# Patient Record
Sex: Female | Born: 1980 | Race: White | Hispanic: No | Marital: Single | Smoking: Current every day smoker
Health system: Southern US, Community
[De-identification: ages and names within clinical notes are randomized; demographics above are authoritative.]

## PROBLEM LIST (undated history)

## (undated) ENCOUNTER — Inpatient Hospital Stay (HOSPITAL_COMMUNITY): Payer: Self-pay

## (undated) DIAGNOSIS — K219 Gastro-esophageal reflux disease without esophagitis: Secondary | ICD-10-CM

## (undated) DIAGNOSIS — C099 Malignant neoplasm of tonsil, unspecified: Secondary | ICD-10-CM

## (undated) DIAGNOSIS — C73 Malignant neoplasm of thyroid gland: Secondary | ICD-10-CM

## (undated) DIAGNOSIS — B009 Herpesviral infection, unspecified: Secondary | ICD-10-CM

## (undated) DIAGNOSIS — C539 Malignant neoplasm of cervix uteri, unspecified: Secondary | ICD-10-CM

## (undated) DIAGNOSIS — F419 Anxiety disorder, unspecified: Secondary | ICD-10-CM

## (undated) DIAGNOSIS — E890 Postprocedural hypothyroidism: Secondary | ICD-10-CM

## (undated) HISTORY — PX: CERVICAL CONE BIOPSY: SUR198

## (undated) HISTORY — PX: APPENDECTOMY: SHX54

## (undated) HISTORY — PX: KNEE SURGERY: SHX244

## (undated) HISTORY — PX: CERVIX SURGERY: SHX593

## (undated) HISTORY — PX: WISDOM TOOTH EXTRACTION: SHX21

## (undated) HISTORY — PX: DIAGNOSTIC LAPAROSCOPY: SUR761

---
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
## 1999-02-13 ENCOUNTER — Inpatient Hospital Stay (HOSPITAL_COMMUNITY): Admission: AD | Admit: 1999-02-13 | Discharge: 1999-02-16 | Payer: Self-pay | Admitting: Obstetrics & Gynecology

## 1999-02-13 ENCOUNTER — Encounter (INDEPENDENT_AMBULATORY_CARE_PROVIDER_SITE_OTHER): Payer: Self-pay | Admitting: Specialist

## 2004-06-27 ENCOUNTER — Emergency Department (HOSPITAL_COMMUNITY): Admission: EM | Admit: 2004-06-27 | Discharge: 2004-06-28 | Payer: Self-pay

## 2004-07-07 ENCOUNTER — Emergency Department (HOSPITAL_COMMUNITY): Admission: EM | Admit: 2004-07-07 | Discharge: 2004-07-08 | Payer: Self-pay | Admitting: Emergency Medicine

## 2004-10-29 ENCOUNTER — Ambulatory Visit (HOSPITAL_COMMUNITY): Admission: RE | Admit: 2004-10-29 | Discharge: 2004-10-29 | Payer: Self-pay | Admitting: Obstetrics and Gynecology

## 2004-10-29 ENCOUNTER — Encounter (INDEPENDENT_AMBULATORY_CARE_PROVIDER_SITE_OTHER): Payer: Self-pay | Admitting: Specialist

## 2005-02-19 ENCOUNTER — Emergency Department (HOSPITAL_COMMUNITY): Admission: EM | Admit: 2005-02-19 | Discharge: 2005-02-19 | Payer: Self-pay | Admitting: Emergency Medicine

## 2005-08-17 IMAGING — CT CT ABDOMEN W/ CM
1 of 4 series · 14 of 32 positions shown, 19 images · IV contrast (omnipaque)
Comparison: 06/28/04.

CLINICAL DATA: Worsening right lower abdominal and pelvic pain.  On antibiotics for pelvic inflammatory disease.  Evaluate for tube ovarian abscess or other acute findings.
TECHNIQUE: Multidetector CT imaging of the abdomen and pelvis was performed during administration of 150 cc Omnipaque 300 intravenous contrast.  Oral contrast was also administered.

[Series 3: — · axial · 0.68mm/px · z∈[+988,+1392]mm · 14 of 93 slices shown, 19 images]
[im 6/93  soft-tissue]
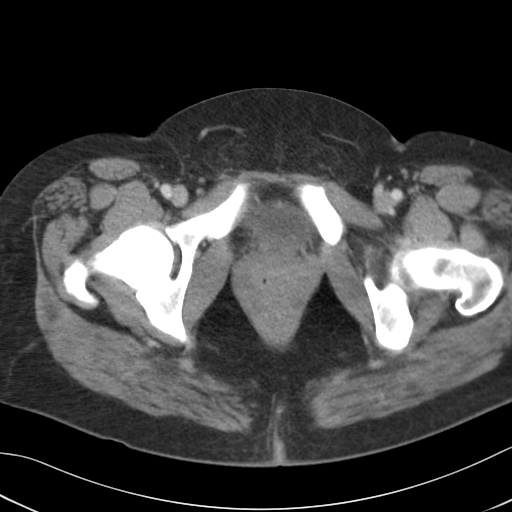
[im 6/93  bone]
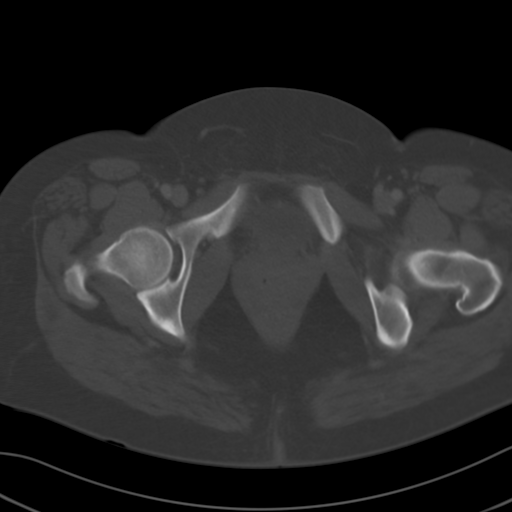
[im 12/93  soft-tissue]
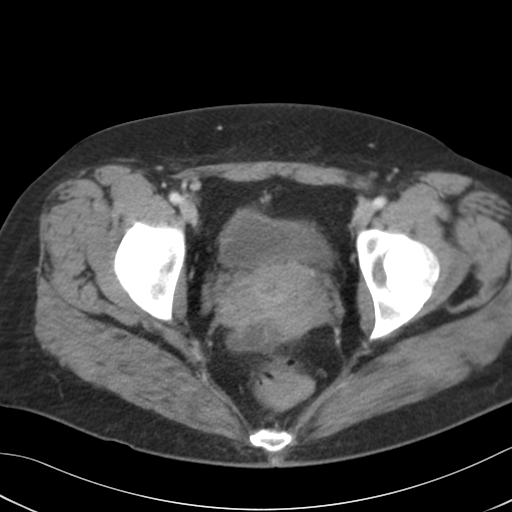
[im 18/93  soft-tissue]
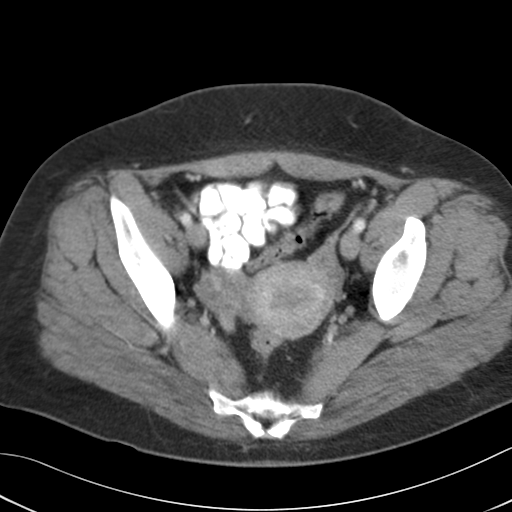
[im 29/93  soft-tissue]
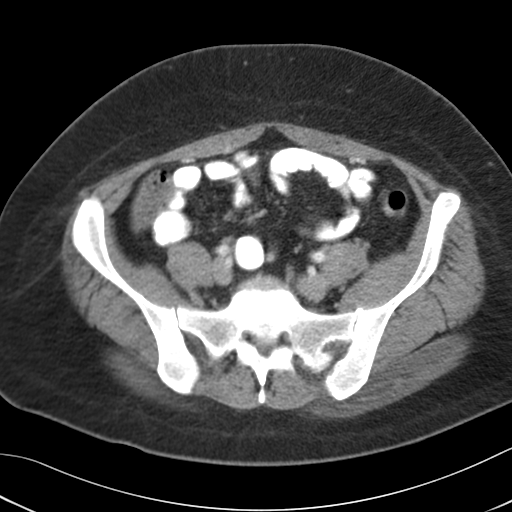
[im 35/93  soft-tissue]
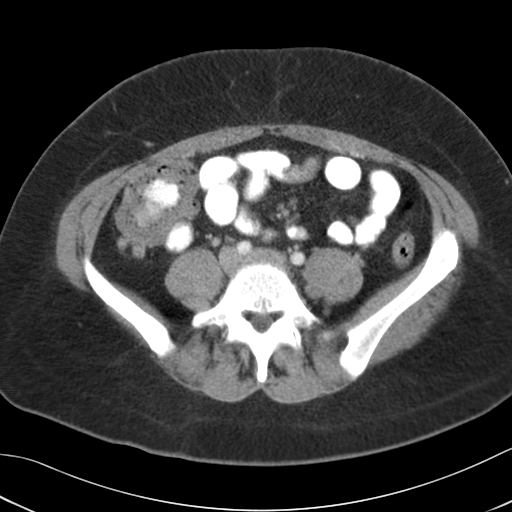
[im 41/93  soft-tissue]
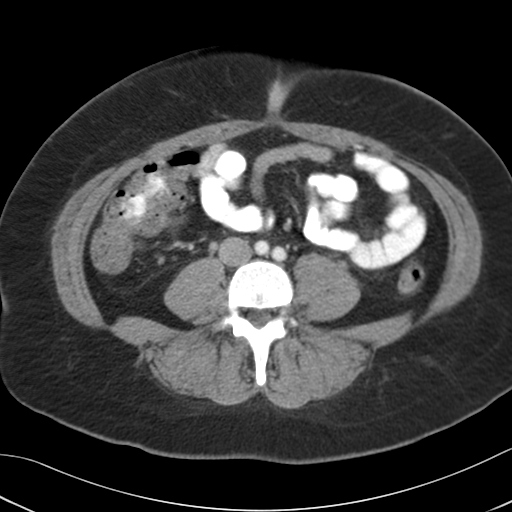
[im 47/93  soft-tissue]
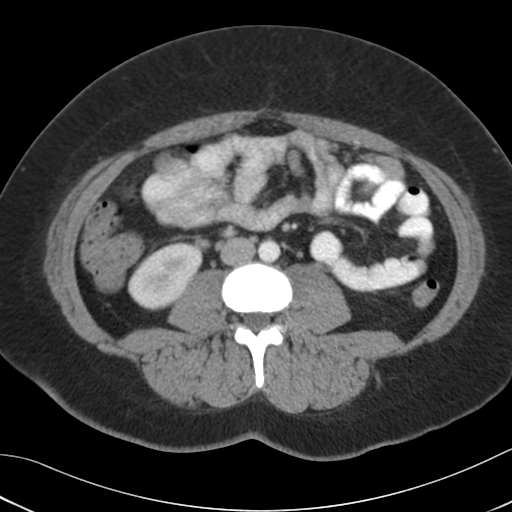
[im 52/93  soft-tissue]
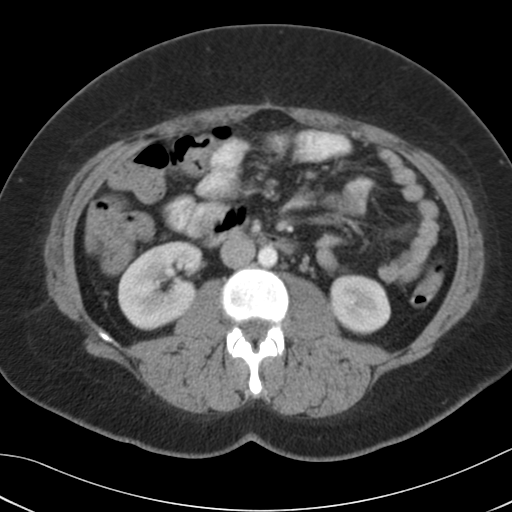
[im 58/93  soft-tissue]
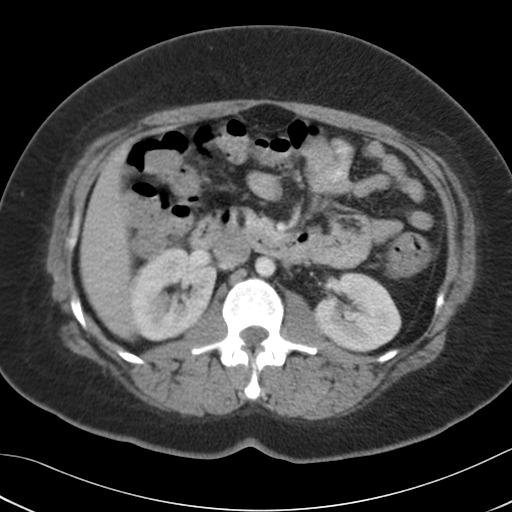
[im 58/93  bone]
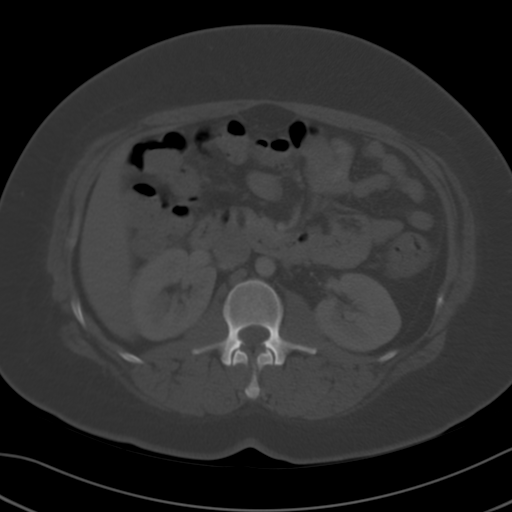
[im 64/93  soft-tissue]
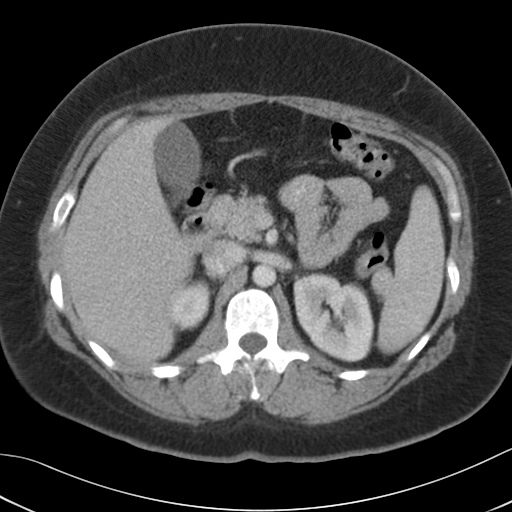
[im 70/93  lung]
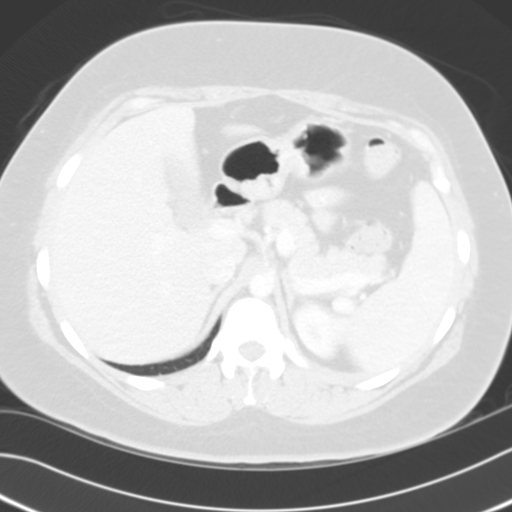
[im 75/93  soft-tissue]
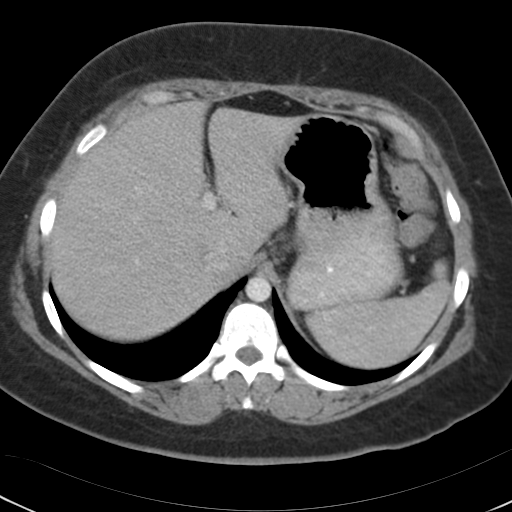
[im 75/93  lung]
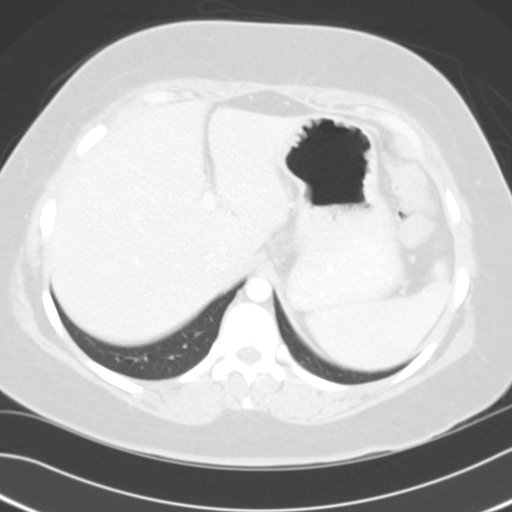
[im 81/93  soft-tissue]
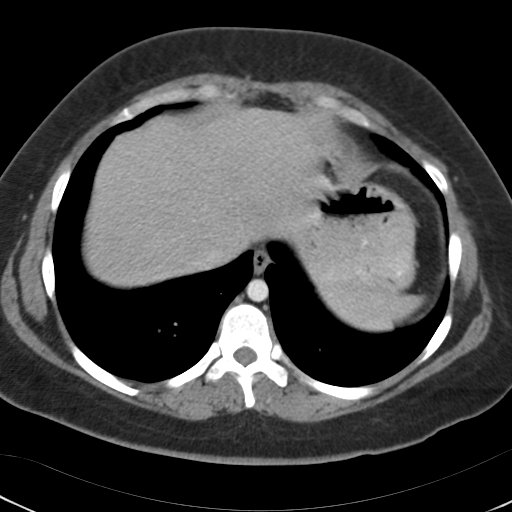
[im 81/93  lung]
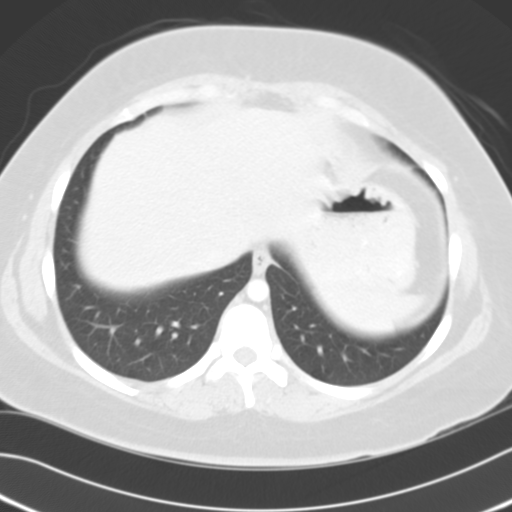
[im 87/93  soft-tissue]
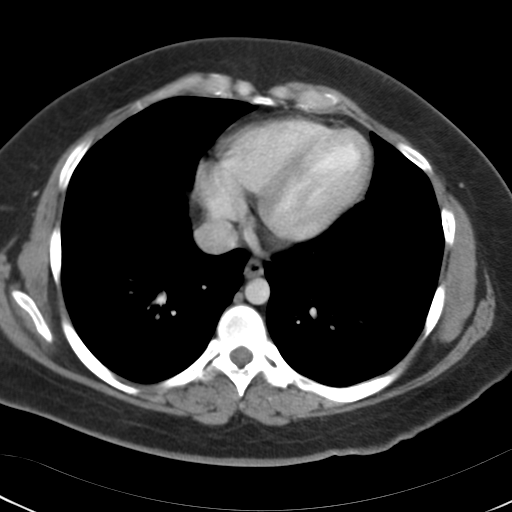
[im 87/93  lung]
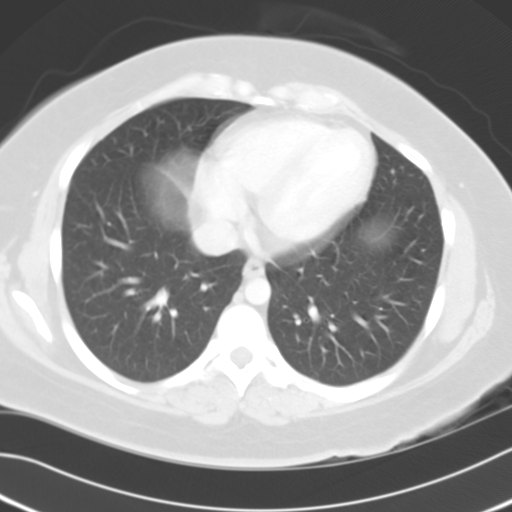

[14 of 32 positions shown; findings below may reference images not displayed]

ABDOMEN CT WITH CONTRAST:
 The abdominal parenchymal organs are normal in appearance.  The gallbladder is unremarkable. 
 No abnormal soft tissue masses are seen.  There is no evidence of adenopathy.  There is no evidence of inflammatory process or abnormal fluid collections.
IMPRESSION: Negative abdomen CT.
 PELVIS CT WITH CONTRAST: 
 There has been near complete resolution of right-sided hydropyosalpinx since prior study.  No masses or adenopathy are identified.  There is no evidence of free fluid.  There is no evidence of other inflammatory process or abnormal fluid collections.  The pelvic bowel loops are unremarkable.
IMPRESSION: Near complete resolution of right-sided hydropyosalpinx since prior study.  No acute findings.

## 2005-12-30 ENCOUNTER — Emergency Department (HOSPITAL_COMMUNITY): Admission: EM | Admit: 2005-12-30 | Discharge: 2005-12-30 | Payer: Self-pay | Admitting: Emergency Medicine

## 2006-10-04 ENCOUNTER — Emergency Department (HOSPITAL_COMMUNITY): Admission: EM | Admit: 2006-10-04 | Discharge: 2006-10-04 | Payer: Self-pay | Admitting: Emergency Medicine

## 2006-10-05 ENCOUNTER — Emergency Department (HOSPITAL_COMMUNITY): Admission: EM | Admit: 2006-10-05 | Discharge: 2006-10-05 | Payer: Self-pay | Admitting: Emergency Medicine

## 2007-02-24 ENCOUNTER — Inpatient Hospital Stay (HOSPITAL_COMMUNITY): Admission: AD | Admit: 2007-02-24 | Discharge: 2007-02-25 | Payer: Self-pay | Admitting: Family Medicine

## 2007-03-04 ENCOUNTER — Inpatient Hospital Stay (HOSPITAL_COMMUNITY): Admission: RE | Admit: 2007-03-04 | Discharge: 2007-03-04 | Payer: Self-pay | Admitting: Family Medicine

## 2007-03-11 ENCOUNTER — Ambulatory Visit (HOSPITAL_COMMUNITY): Admission: RE | Admit: 2007-03-11 | Discharge: 2007-03-11 | Payer: Self-pay | Admitting: Family Medicine

## 2007-03-24 ENCOUNTER — Inpatient Hospital Stay (HOSPITAL_COMMUNITY): Admission: AD | Admit: 2007-03-24 | Discharge: 2007-03-24 | Payer: Self-pay | Admitting: Obstetrics and Gynecology

## 2007-03-31 ENCOUNTER — Ambulatory Visit (HOSPITAL_COMMUNITY): Admission: RE | Admit: 2007-03-31 | Discharge: 2007-03-31 | Payer: Self-pay | Admitting: Obstetrics and Gynecology

## 2007-04-05 ENCOUNTER — Ambulatory Visit (HOSPITAL_COMMUNITY): Admission: RE | Admit: 2007-04-05 | Discharge: 2007-04-05 | Payer: Self-pay | Admitting: Obstetrics and Gynecology

## 2007-04-05 ENCOUNTER — Encounter (INDEPENDENT_AMBULATORY_CARE_PROVIDER_SITE_OTHER): Payer: Self-pay | Admitting: Obstetrics and Gynecology

## 2007-04-05 DIAGNOSIS — O034 Incomplete spontaneous abortion without complication: Secondary | ICD-10-CM | POA: Insufficient documentation

## 2007-04-26 ENCOUNTER — Emergency Department (HOSPITAL_COMMUNITY): Admission: EM | Admit: 2007-04-26 | Discharge: 2007-04-26 | Payer: Self-pay | Admitting: Emergency Medicine

## 2007-06-14 ENCOUNTER — Emergency Department (HOSPITAL_COMMUNITY): Admission: EM | Admit: 2007-06-14 | Discharge: 2007-06-15 | Payer: Self-pay | Admitting: *Deleted

## 2007-07-09 ENCOUNTER — Emergency Department (HOSPITAL_COMMUNITY): Admission: EM | Admit: 2007-07-09 | Discharge: 2007-07-09 | Payer: Self-pay | Admitting: *Deleted

## 2007-09-30 ENCOUNTER — Ambulatory Visit: Payer: Self-pay | Admitting: Nurse Practitioner

## 2007-09-30 DIAGNOSIS — B977 Papillomavirus as the cause of diseases classified elsewhere: Secondary | ICD-10-CM

## 2007-09-30 DIAGNOSIS — F332 Major depressive disorder, recurrent severe without psychotic features: Secondary | ICD-10-CM | POA: Insufficient documentation

## 2007-09-30 DIAGNOSIS — N949 Unspecified condition associated with female genital organs and menstrual cycle: Secondary | ICD-10-CM

## 2007-09-30 LAB — CONVERTED CEMR LAB
ALT: 19 units/L (ref 0–35)
AST: 15 units/L (ref 0–37)
Albumin: 4.6 g/dL (ref 3.5–5.2)
Alkaline Phosphatase: 104 units/L (ref 39–117)
BUN: 9 mg/dL (ref 6–23)
Basophils Absolute: 0 10*3/uL (ref 0.0–0.1)
Basophils Relative: 0 % (ref 0–1)
Beta hcg, urine, semiquantitative: NEGATIVE
Bilirubin Urine: NEGATIVE
Blood in Urine, dipstick: NEGATIVE
CO2: 22 meq/L (ref 19–32)
Calcium: 9.3 mg/dL (ref 8.4–10.5)
Chloride: 104 meq/L (ref 96–112)
Creatinine, Ser: 0.66 mg/dL (ref 0.40–1.20)
Eosinophils Absolute: 0.2 10*3/uL (ref 0.0–0.7)
Eosinophils Relative: 2 % (ref 0–5)
Glucose, Bld: 75 mg/dL (ref 70–99)
Glucose, Urine, Semiquant: NEGATIVE
HCT: 43.8 % (ref 36.0–46.0)
Hemoglobin: 14.6 g/dL (ref 12.0–15.0)
Ketones, urine, test strip: NEGATIVE
Lymphocytes Relative: 26 % (ref 12–46)
Lymphs Abs: 2.6 10*3/uL (ref 0.7–4.0)
MCHC: 33.3 g/dL (ref 30.0–36.0)
MCV: 91.6 fL (ref 78.0–100.0)
Monocytes Absolute: 0.5 10*3/uL (ref 0.1–1.0)
Monocytes Relative: 5 % (ref 3–12)
Neutro Abs: 6.7 10*3/uL (ref 1.7–7.7)
Neutrophils Relative %: 67 % (ref 43–77)
Nitrite: NEGATIVE
Platelets: 212 10*3/uL (ref 150–400)
Potassium: 3.9 meq/L (ref 3.5–5.3)
Preg, Serum: NEGATIVE
Protein, U semiquant: 30
RBC: 4.78 M/uL (ref 3.87–5.11)
RDW: 13.9 % (ref 11.5–15.5)
Sodium: 139 meq/L (ref 135–145)
Specific Gravity, Urine: 1.025
TSH: 1.416 microintl units/mL (ref 0.350–5.50)
Total Bilirubin: 0.5 mg/dL (ref 0.3–1.2)
Total Protein: 6.8 g/dL (ref 6.0–8.3)
Urobilinogen, UA: 0.2
WBC: 10 10*3/uL (ref 4.0–10.5)
pH: 6

## 2007-10-03 ENCOUNTER — Encounter (INDEPENDENT_AMBULATORY_CARE_PROVIDER_SITE_OTHER): Payer: Self-pay | Admitting: Nurse Practitioner

## 2007-10-06 ENCOUNTER — Encounter (INDEPENDENT_AMBULATORY_CARE_PROVIDER_SITE_OTHER): Payer: Self-pay | Admitting: Nurse Practitioner

## 2007-10-10 ENCOUNTER — Encounter (INDEPENDENT_AMBULATORY_CARE_PROVIDER_SITE_OTHER): Payer: Self-pay | Admitting: Nurse Practitioner

## 2007-10-11 ENCOUNTER — Encounter (INDEPENDENT_AMBULATORY_CARE_PROVIDER_SITE_OTHER): Payer: Self-pay | Admitting: Nurse Practitioner

## 2007-10-14 ENCOUNTER — Encounter (INDEPENDENT_AMBULATORY_CARE_PROVIDER_SITE_OTHER): Payer: Self-pay | Admitting: Nurse Practitioner

## 2007-12-16 ENCOUNTER — Ambulatory Visit (HOSPITAL_BASED_OUTPATIENT_CLINIC_OR_DEPARTMENT_OTHER): Admission: RE | Admit: 2007-12-16 | Discharge: 2007-12-16 | Payer: Self-pay | Admitting: Obstetrics and Gynecology

## 2008-01-09 ENCOUNTER — Ambulatory Visit: Payer: Self-pay | Admitting: Family Medicine

## 2008-01-09 DIAGNOSIS — F17200 Nicotine dependence, unspecified, uncomplicated: Secondary | ICD-10-CM | POA: Insufficient documentation

## 2008-01-09 DIAGNOSIS — J209 Acute bronchitis, unspecified: Secondary | ICD-10-CM | POA: Insufficient documentation

## 2008-04-20 IMAGING — US US OB TRANSVAGINAL
1 series · 14 of 25 positions shown · non-contrast
Comparison: none

OBSTETRICAL ULTRASOUND:

 This ultrasound exam was performed in the [HOSPITAL] Ultrasound Department.  The OB US report was generated in the AS system, and faxed to the ordering physician.  This report is also available in [REDACTED] PACS.

[Series 1: us ob transvaginal · 14 of 25 slices shown]
[im 1/25]
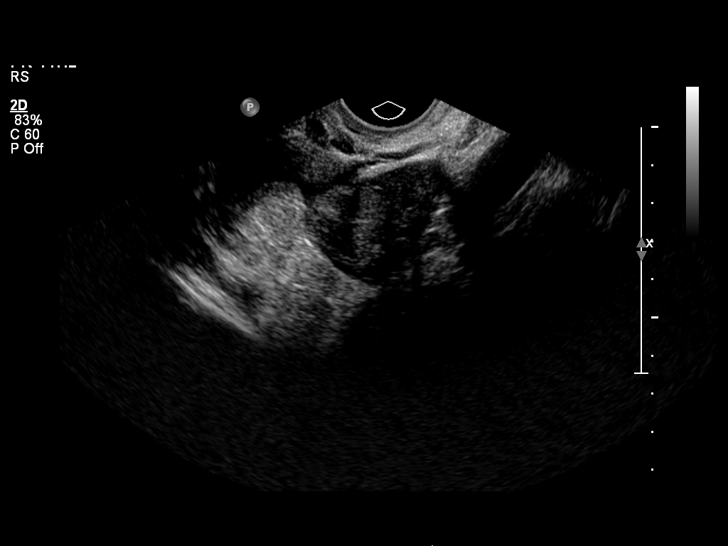
[im 3/25]
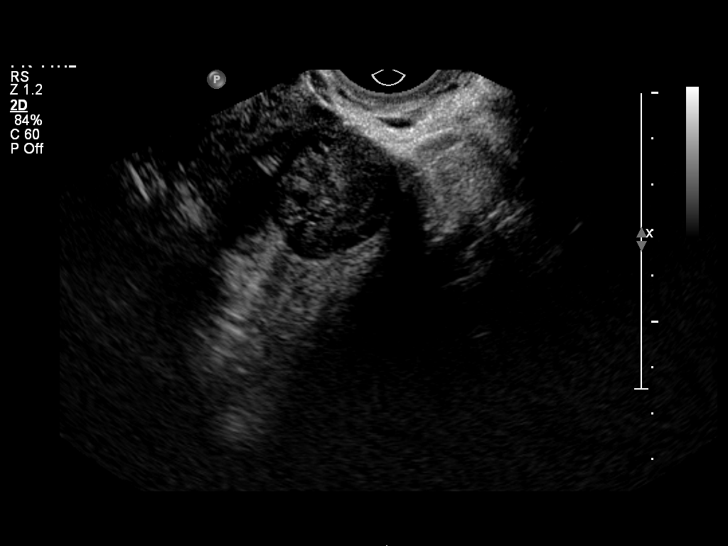
[im 5/25]
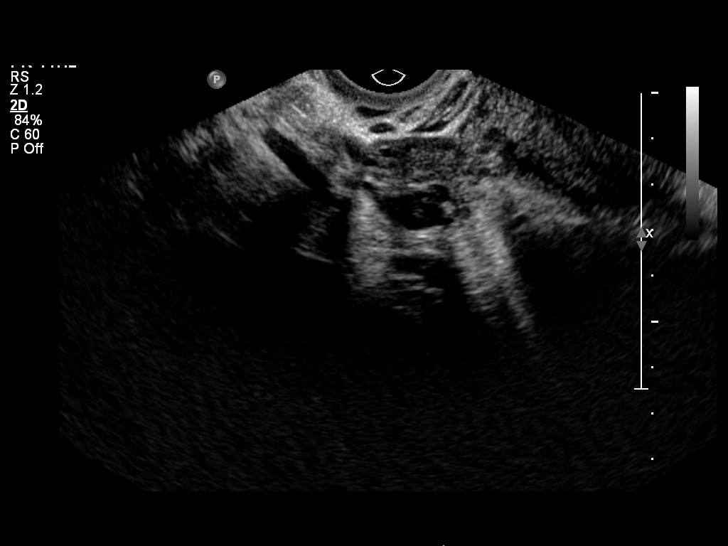
[im 7/25]
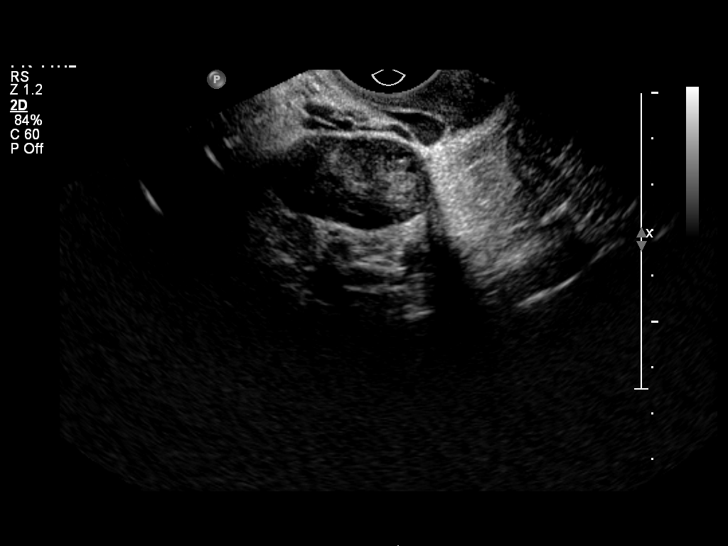
[im 9/25]
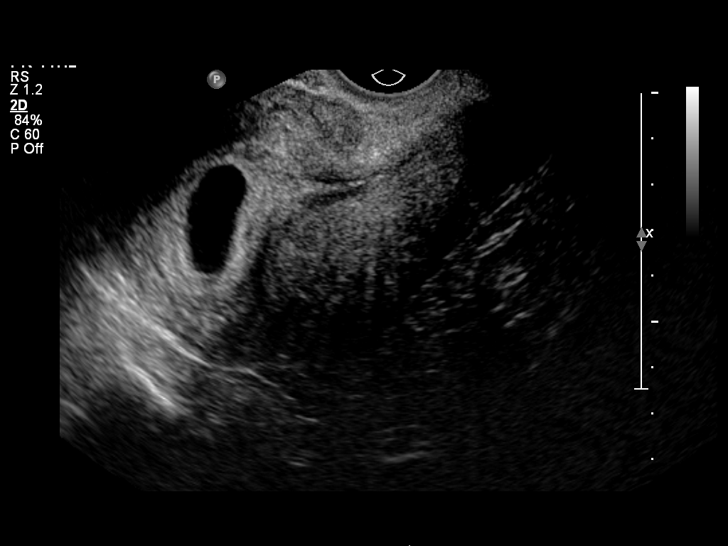
[im 10/25]
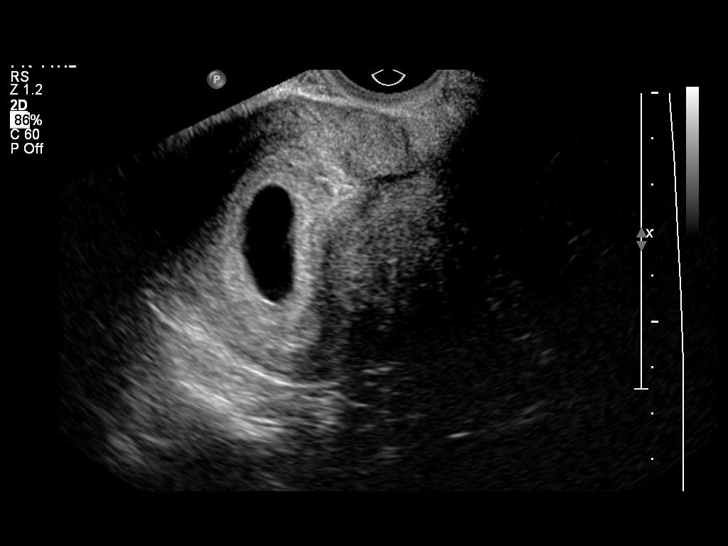
[im 12/25]
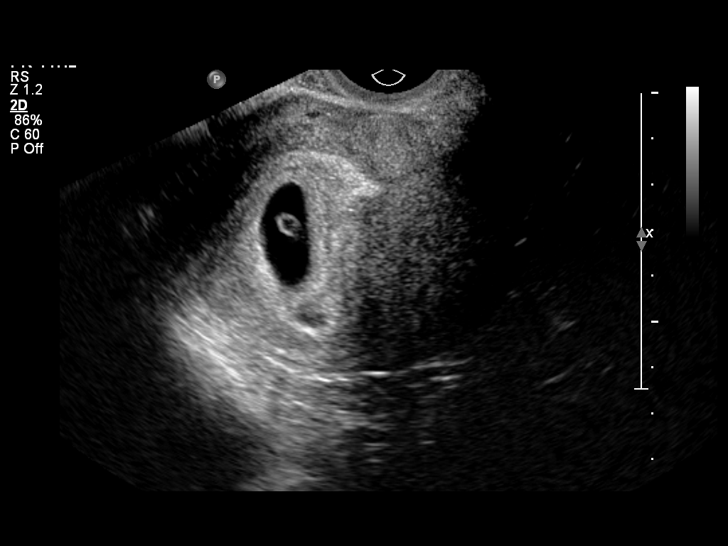
[im 14/25]
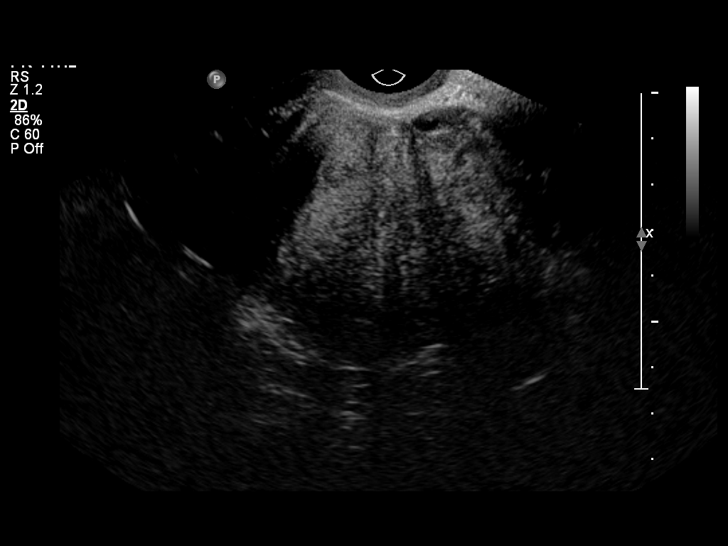
[im 16/25]
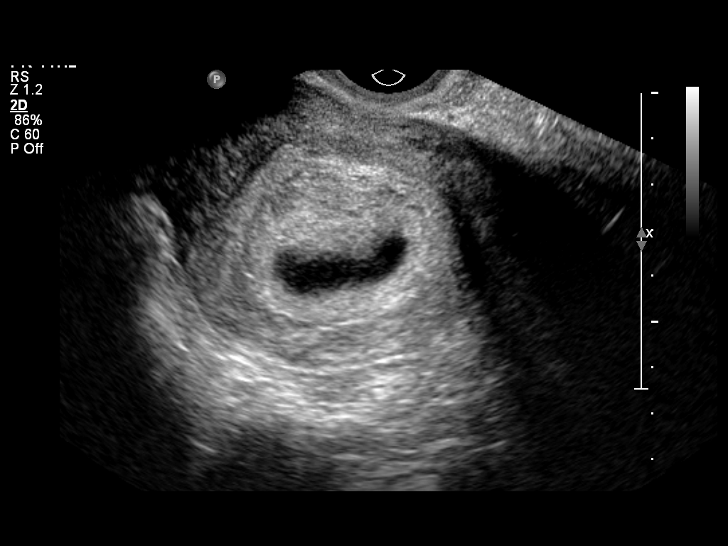
[im 17/25]
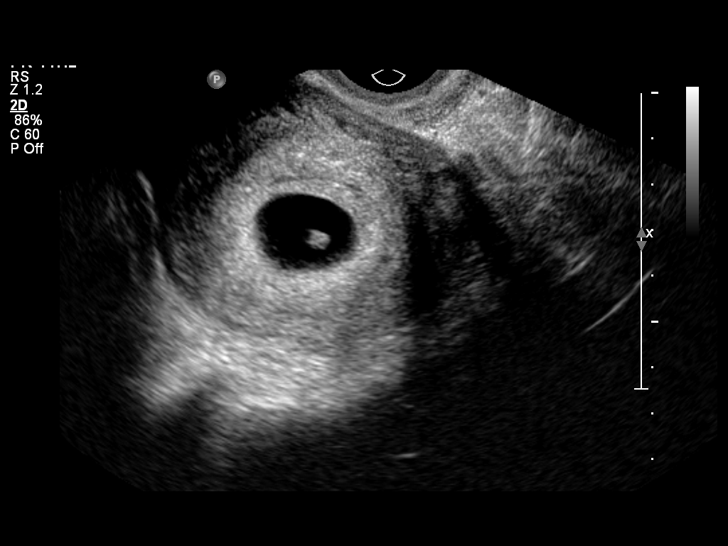
[im 19/25]
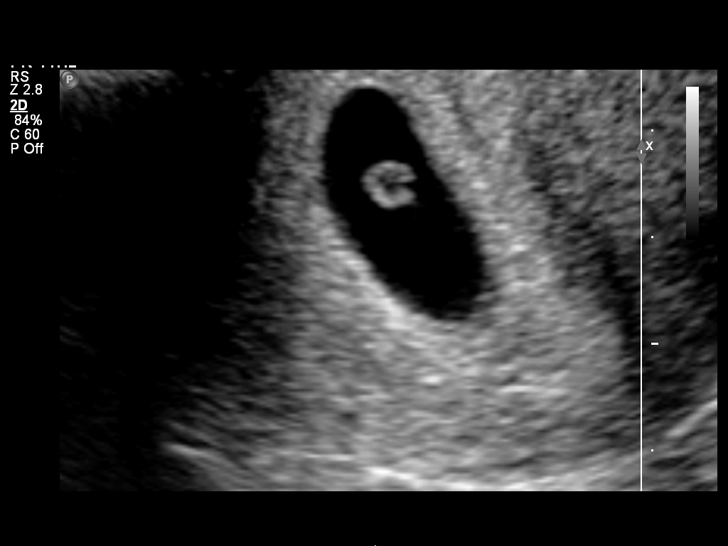
[im 21/25]
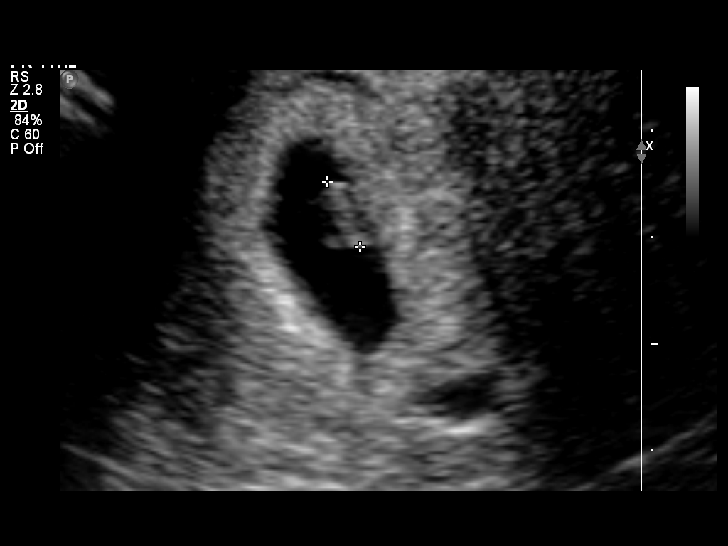
[im 23/25]
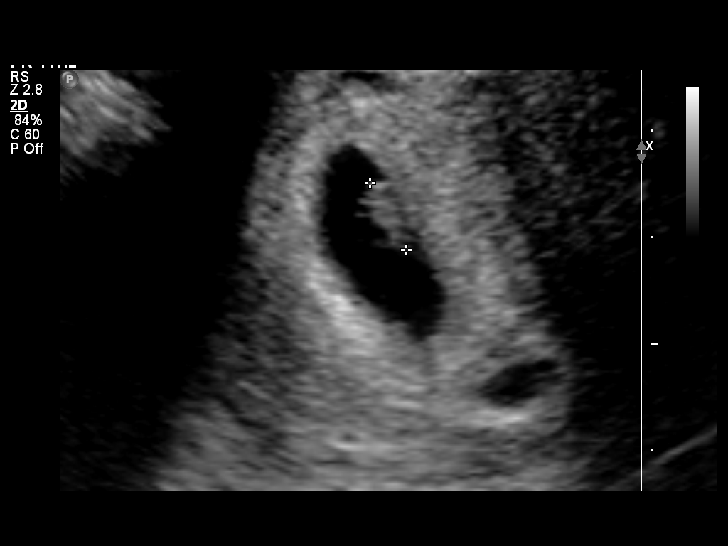
[im 25/25]
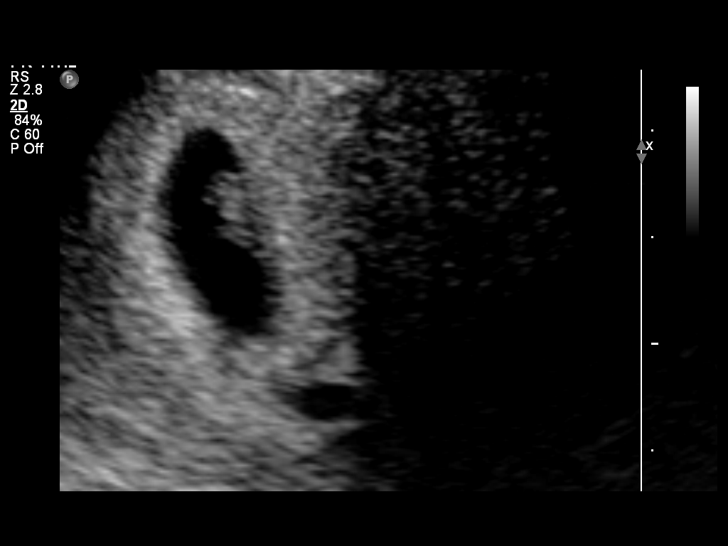

[14 of 25 positions shown; findings below may reference images not displayed]

IMPRESSION: See AS Obstetric US report.

## 2008-06-12 ENCOUNTER — Encounter: Payer: Self-pay | Admitting: Emergency Medicine

## 2008-06-12 ENCOUNTER — Inpatient Hospital Stay (HOSPITAL_COMMUNITY): Admission: AD | Admit: 2008-06-12 | Discharge: 2008-06-15 | Payer: Self-pay | Admitting: General Surgery

## 2008-06-12 ENCOUNTER — Encounter (INDEPENDENT_AMBULATORY_CARE_PROVIDER_SITE_OTHER): Payer: Self-pay | Admitting: Surgery

## 2008-11-29 ENCOUNTER — Ambulatory Visit: Payer: Self-pay | Admitting: Nurse Practitioner

## 2008-11-29 DIAGNOSIS — E669 Obesity, unspecified: Secondary | ICD-10-CM | POA: Insufficient documentation

## 2008-11-29 DIAGNOSIS — K219 Gastro-esophageal reflux disease without esophagitis: Secondary | ICD-10-CM

## 2008-11-29 DIAGNOSIS — R5383 Other fatigue: Secondary | ICD-10-CM

## 2008-11-29 DIAGNOSIS — R5381 Other malaise: Secondary | ICD-10-CM

## 2008-12-03 DIAGNOSIS — E559 Vitamin D deficiency, unspecified: Secondary | ICD-10-CM | POA: Insufficient documentation

## 2008-12-03 LAB — CONVERTED CEMR LAB
ALT: 32 units/L (ref 0–35)
AST: 20 units/L (ref 0–37)
Albumin: 4.6 g/dL (ref 3.5–5.2)
Alkaline Phosphatase: 114 units/L (ref 39–117)
BUN: 12 mg/dL (ref 6–23)
Basophils Absolute: 0 10*3/uL (ref 0.0–0.1)
Basophils Relative: 0 % (ref 0–1)
CO2: 21 meq/L (ref 19–32)
Calcium: 9.5 mg/dL (ref 8.4–10.5)
Chloride: 105 meq/L (ref 96–112)
Creatinine, Ser: 0.82 mg/dL (ref 0.40–1.20)
Eosinophils Absolute: 0.2 10*3/uL (ref 0.0–0.7)
Eosinophils Relative: 2 % (ref 0–5)
Glucose, Bld: 69 mg/dL — ABNORMAL LOW (ref 70–99)
HCT: 43.8 % (ref 36.0–46.0)
Helicobacter pylori, IgM: <0.89 (ref <0.89)
Hemoglobin: 15 g/dL (ref 12.0–15.0)
Lymphocytes Relative: 31 % (ref 12–46)
Lymphs Abs: 3.5 10*3/uL (ref 0.7–4.0)
MCHC: 34.2 g/dL (ref 30.0–36.0)
MCV: 89.2 fL (ref 78.0–100.0)
Monocytes Absolute: 0.5 10*3/uL (ref 0.1–1.0)
Monocytes Relative: 4 % (ref 3–12)
Neutro Abs: 6.9 10*3/uL (ref 1.7–7.7)
Neutrophils Relative %: 62 % (ref 43–77)
Platelets: 211 10*3/uL (ref 150–400)
Potassium: 4 meq/L (ref 3.5–5.3)
RBC: 4.91 M/uL (ref 3.87–5.11)
RDW: 13.1 % (ref 11.5–15.5)
Sodium: 141 meq/L (ref 135–145)
TSH: 1.492 microintl units/mL (ref 0.350–4.500)
Total Bilirubin: 0.5 mg/dL (ref 0.3–1.2)
Total Protein: 7 g/dL (ref 6.0–8.3)
Vit D, 25-Hydroxy: 11 ng/mL — ABNORMAL LOW (ref 30–89)
WBC: 11.1 10*3/uL — ABNORMAL HIGH (ref 4.0–10.5)

## 2009-02-19 ENCOUNTER — Ambulatory Visit (HOSPITAL_COMMUNITY): Admission: RE | Admit: 2009-02-19 | Discharge: 2009-02-19 | Payer: Self-pay | Admitting: Obstetrics and Gynecology

## 2009-02-19 ENCOUNTER — Encounter (INDEPENDENT_AMBULATORY_CARE_PROVIDER_SITE_OTHER): Payer: Self-pay | Admitting: Obstetrics and Gynecology

## 2009-07-01 ENCOUNTER — Telehealth (INDEPENDENT_AMBULATORY_CARE_PROVIDER_SITE_OTHER): Payer: Self-pay | Admitting: Internal Medicine

## 2009-07-15 ENCOUNTER — Ambulatory Visit: Payer: Self-pay | Admitting: Physician Assistant

## 2009-07-15 DIAGNOSIS — H6690 Otitis media, unspecified, unspecified ear: Secondary | ICD-10-CM | POA: Insufficient documentation

## 2009-10-31 ENCOUNTER — Telehealth (INDEPENDENT_AMBULATORY_CARE_PROVIDER_SITE_OTHER): Payer: Self-pay | Admitting: Nurse Practitioner

## 2009-11-03 ENCOUNTER — Emergency Department (HOSPITAL_BASED_OUTPATIENT_CLINIC_OR_DEPARTMENT_OTHER): Admission: EM | Admit: 2009-11-03 | Discharge: 2009-11-03 | Payer: Self-pay | Admitting: Emergency Medicine

## 2009-11-07 ENCOUNTER — Telehealth (INDEPENDENT_AMBULATORY_CARE_PROVIDER_SITE_OTHER): Payer: Self-pay | Admitting: Nurse Practitioner

## 2010-01-17 ENCOUNTER — Telehealth (INDEPENDENT_AMBULATORY_CARE_PROVIDER_SITE_OTHER): Payer: Self-pay | Admitting: Nurse Practitioner

## 2010-01-18 ENCOUNTER — Encounter (INDEPENDENT_AMBULATORY_CARE_PROVIDER_SITE_OTHER): Payer: Self-pay | Admitting: Nurse Practitioner

## 2010-01-18 ENCOUNTER — Emergency Department (HOSPITAL_BASED_OUTPATIENT_CLINIC_OR_DEPARTMENT_OTHER): Admission: EM | Admit: 2010-01-18 | Discharge: 2010-01-18 | Payer: Self-pay | Admitting: Emergency Medicine

## 2010-01-22 ENCOUNTER — Ambulatory Visit: Payer: Self-pay | Admitting: Nurse Practitioner

## 2010-01-22 DIAGNOSIS — R059 Cough, unspecified: Secondary | ICD-10-CM | POA: Insufficient documentation

## 2010-01-22 DIAGNOSIS — R05 Cough: Secondary | ICD-10-CM

## 2010-08-26 NOTE — Progress Notes (Signed)
Summary: TOENAIL IS INFECTED AND VERY SORE  Phone Note Call from Patient Call back at Home Phone [phone]   Reason for Call: Talk to Nurse Summary of Call: MARTIN PT MS Meeker CALLED AND SAYS THAT HER BIG TOE ON HER LEFT FOOT IS PROBABLY INFECTED AND ITS CAUSING HER SOME PAIN AND THE NAIL IS RAISING UP AND KIND OF SITTING TO THE SIDE AND VERY SORE. Initial call taken by: Leodis Rains,  January 17, 2010 12:48 PM  Follow-up for Phone Call        pt went to hospital and they cut side of nail off and told pt that she would need to follow up with PCP to get referred to podiatry.  Pt is also having a cough and wanted to know it she can get medication prescribed for it she is scheduled to come in tomorrow for a f/u on toe and cough. Follow-up by: Levon Hedger,  January 21, 2010 5:05 PM

## 2010-08-26 NOTE — Assessment & Plan Note (Signed)
Summary: Acute - Bronchitis   Vital Signs:  Patient profile:   30 year old female Menstrual status:  irregular LMP:     12/2009 Weight:      249.0 pounds BMI:     39.14 Temp:     97.6 degrees F oral Pulse rate:   81 / minute Pulse rhythm:   regular Resp:     20 per minute BP sitting:   125 / 82  (left arm) Cuff size:   large  Vitals Entered By: Levon Hedger (January 22, 2010 11:50 AM) CC: follow-up visit from ER...cough x 3 weeks, Abdominal Pain Is Patient Diabetic? No Pain Assessment Patient in pain? no       Does patient need assistance? Functional Status Self care Ambulation Normal LMP (date): 12/2009     Enter LMP: 12/2009   Primary Care Provider:  Lehman Prom FNP  CC:  follow-up visit from ER...cough x 3 weeks and Abdominal Pain.  History of Present Illness:  Pt into the ER with C/O ingrown toenail Left toenail with both medial and lateral edges of toenail removed constant pain alleviated but  Still unable to allow the covers to touch the toe at night given naprosyn as needed but she has only been taking otc aleve  Conjunctivis - left eye redness on 01/18/2010 upon presentation to the ER given erythromycin ointment and symptoms have resolved  Cough - mostly when she wakes in the morning and lays down at night. Brownish sputum production Tobacco use continues No hx of asthma right ear pain at times with swallowing denies any headache    Dyspepsia History:      She has no alarm features of dyspepsia including no history of melena, hematochezia, dysphagia, persistent vomiting, or involuntary weight loss > 5%.  There is a prior history of GERD.  The patient does not have a prior history of documented ulcer disease.  The dominant symptom is heartburn or acid reflux.  An H-2 blocker medication is not currently being taken.  She has no history of a positive H. Pylori serology.  No previous upper endoscopy has been done.     Habits &  Providers  Alcohol-Tobacco-Diet     Alcohol drinks/day: 0     Tobacco Status: current     Tobacco Counseling: to quit use of tobacco products     Cigarette Packs/Day: 1.0  Exercise-Depression-Behavior     Does Patient Exercise: no     Drug Use: never     Seat Belt Use: 100     Sun Exposure: occasionally  Allergies (verified): 1)  ! Prednisone  Review of Systems General:  Denies fever. ENT:  Complains of earache and sore throat. CV:  Denies chest pain or discomfort. Resp:  Complains of cough. GI:  Complains of indigestion; denies abdominal pain.  Physical Exam  General:  alert.   Head:  normocephalic.   Eyes:  pupils round.   Ears:  slight redness with left TM Nose:  no nasal discharge.   Mouth:  pharynx pink and moist.   Lungs:  normal breath sounds.   Heart:  normal rate and regular rhythm.   Abdomen:  normal bowel sounds.   Msk:  up to the exam table Neurologic:  alert & oriented X3.   Skin:  left great toe - evidence of medial and lateral toenail removal no swelling Psych:  Oriented X3.     Impression & Recommendations:  Problem # 1:  BRONCHITIS, ACUTE (ICD-466.0)  advised pt to  get mucinex may take tylenol with codeine in between  The following medications were removed from the medication list:    Tessalon Perles 100 Mg Caps (Benzonatate) .Marland Kitchen... Take 1 tablet by mouth three times a day as needed for cough Her updated medication list for this problem includes:    Proventil Hfa 108 (90 Base) Mcg/act Aers (Albuterol sulfate) .Marland Kitchen... 2 puffs ever 6 hours as needed for cough and shortness of breath  Problem # 2:  TOBACCO ABUSE (ICD-305.1) advised cessation  Problem # 3:  GERD (ICD-530.81) will start on nexium The following medications were removed from the medication list:    Omeprazole 20 Mg Tbec (Omeprazole) ..... One capsule by mouth twice daily before breakfast and dinner Her updated medication list for this problem includes:    Nexium 40 Mg Cpdr  (Esomeprazole magnesium) ..... One tablet by mouth before breakfast  Complete Medication List: 1)  Flonase 50 Mcg/act Susp (Fluticasone propionate) .Marland Kitchen.. 1 spray each nostril once daily (use for 3 weeks, then taper to stop) 2)  Nexium 40 Mg Cpdr (Esomeprazole magnesium) .... One tablet by mouth before breakfast 3)  Acetaminophen-codeine 120-12 Mg/37ml Soln (Acetaminophen-codeine) .... One to two teaspoons ever 6 hours as needed for cough 4)  Proventil Hfa 108 (90 Base) Mcg/act Aers (Albuterol sulfate) .... 2 puffs ever 6 hours as needed for cough and shortness of breath  Other Orders: Rapid Strep (04540)  Dyspepsia Assessment/Plan:  Step Therapy: GERD Treatment Protocols:    Step-1: started  Patient Instructions: 1)  You should take Mucinex DM by mouth two times a day as this is helpful with cough and congestion. 2)  Drink plenty of fluids 3)  may take cough syrup in between as this may help some with pain as well but take with food to prevent nausea. 4)  Use the inhaler - 2 puffs before you go to bed to open up your airway and in the mornings until congestion has cleared then as needed  Prescriptions: PROVENTIL HFA 108 (90 BASE) MCG/ACT AERS (ALBUTEROL SULFATE) 2 puffs ever 6 hours as needed for cough and shortness of breath  #1 x 0   Entered and Authorized by:   Lehman Prom FNP   Signed by:   Lehman Prom FNP on 01/22/2010   Method used:   Print then Give to Patient   RxID:   [id number] ACETAMINOPHEN-CODEINE 120-12 MG/5ML SOLN (ACETAMINOPHEN-CODEINE) One to Two teaspoons ever 6 hours as needed for cough  #4 ounces x 0   Entered and Authorized by:   Lehman Prom FNP   Signed by:   Lehman Prom FNP on 01/22/2010   Method used:   Print then Give to Patient   RxID:   [id number] NEXIUM 40 MG CPDR (ESOMEPRAZOLE MAGNESIUM) One tablet by mouth before breakfast  #30 x 5   Entered and Authorized by:   Lehman Prom FNP   Signed by:   Lehman Prom FNP on  01/22/2010   Method used:   Print then Give to Patient   RxID:   [id number]

## 2010-08-26 NOTE — Progress Notes (Signed)
Summary: REF. TO WEIGHT LOSS CLINIC  Phone Note Call from Patient Call back at Home Phone [phone]   Reason for Call: Referral Summary of Call: MARTIN PT. MS Skillin CALLED AND WANTS TO KNOW IF SHE CAN GET A REFERRAL TO A WEIGHT LOSS CLINIC IN [place], [place]. Initial call taken by: Leodis Rains,  October 31, 2009 11:27 AM  Follow-up for Phone Call        forward to [facility] FNP Follow-up by: Levon Hedger,  October 31, 2009 12:11 PM  Additional Follow-up for Phone Call Additional follow up Details #1::        I need the name of the office, phone, and fax number to where pt is requesting to be referred. Also she should check with them to be sure they take medicaids patient   Additional Follow-up by: Lehman Prom FNP,  October 31, 2009 12:16 PM    Additional Follow-up for Phone Call Additional follow up Details #2::    called [phone] left message on machine for pt to return call to the office. Levon Hedger  November 01, 2009 9:56 AM  4.14.11 pt aware. pt will call back with all information..cm  Information is as follows: [facility] Dr. Mauricio Po [address] [place] [address] [phone] fax [phone] *they do accept Medicaid as pt ha already asked, she only need the referral Chantel Hyacinth Meeker  November 07, 2009 11:18 AM   Additional Follow-up for Phone Call Additional follow up Details #3:: Details for Additional Follow-up Action Taken: ok. will order referral Additional Follow-up by: Lehman Prom FNP,  November 07, 2009 11:23 AM

## 2010-08-26 NOTE — Progress Notes (Signed)
Summary: Weight loss Clinic referral  Phone Note Outgoing Call   Summary of Call: referral ordered to weight loss clinic pt has provided the following information of an office that accepts medicaid [facility] Dr. Mauricio Po [address] [place] [address] [phone] fax [phone] Initial call taken by: Lehman Prom FNP,  November 07, 2009 11:24 AM

## 2010-08-26 NOTE — Letter (Signed)
Summary: CALL A NURSE  CALL A NURSE   Imported By: Arta Bruce 01/22/2010 12:38:08  _____________________________________________________________________  External Attachment:    Type:   Image     Comment:   External Document

## 2010-11-02 LAB — CBC
HCT: 42 % (ref 36.0–46.0)
Hemoglobin: 14.7 g/dL (ref 12.0–15.0)
MCHC: 34.9 g/dL (ref 30.0–36.0)
MCV: 91.2 fL (ref 78.0–100.0)
Platelets: 176 10*3/uL (ref 150–400)
RBC: 4.61 MIL/uL (ref 3.87–5.11)
RDW: 12.9 % (ref 11.5–15.5)
WBC: 11 10*3/uL — ABNORMAL HIGH (ref 4.0–10.5)

## 2010-11-02 LAB — COMPREHENSIVE METABOLIC PANEL
ALT: 35 U/L (ref 0–35)
AST: 24 U/L (ref 0–37)
Albumin: 3.9 g/dL (ref 3.5–5.2)
Alkaline Phosphatase: 103 U/L (ref 39–117)
BUN: 10 mg/dL (ref 6–23)
CO2: 27 mEq/L (ref 19–32)
Calcium: 9 mg/dL (ref 8.4–10.5)
Chloride: 104 mEq/L (ref 96–112)
Creatinine, Ser: 0.8 mg/dL (ref 0.4–1.2)
GFR calc Af Amer: >60 mL/min (ref >60)
GFR calc non Af Amer: >60 mL/min (ref >60)
Glucose, Bld: 100 mg/dL — ABNORMAL HIGH (ref 70–99)
Potassium: 3.7 mEq/L (ref 3.5–5.1)
Sodium: 139 mEq/L (ref 135–145)
Total Bilirubin: 0.4 mg/dL (ref 0.3–1.2)
Total Protein: 6.5 g/dL (ref 6.0–8.3)

## 2010-11-02 LAB — PREGNANCY, URINE: Preg Test, Ur: NEGATIVE

## 2010-12-09 NOTE — Op Note (Signed)
Madison Matthews, Madison Matthews                ACCOUNT NO.:  [id number]   MEDICAL RECORD NO.:  [id number]          PATIENT TYPE:  AMB   LOCATION:  SDC                           FACILITY:  [facility]   PHYSICIAN:  Malachi Pro. Ambrose Mantle, M.D. DATE OF BIRTH:  08/20/1980   DATE OF PROCEDURE:  02/19/2009  DATE OF DISCHARGE:                               OPERATIVE REPORT   PREOPERATIVE DIAGNOSES:  High-grade cervical intraepithelial neoplasia  (CIN-III), right vulvar lesion.   POSTOPERATIVE DIAGNOSES:  High-grade cervical intraepithelial neoplasia  (CIN-III), right vulvar lesion.   OPERATIONS:  Dilation and curettage, cone, removal of right vulvar  lesion.   OPERATOR:  Malachi Pro. Ambrose Mantle, MD   ANESTHESIA:  General.   The patient was brought to the operating room and placed under  satisfactory general anesthesia, placed in lithotomy position.  The  cervix was exposed and painted with 5% acetic acid.  The colposcope was  used to visualize the huge lesion involving all 4 quadrants of the  cervix, extending all the way to the vaginal fornices in all quadrants,  but especially in the posterior fornix.  A marking pen was used to try  to outline the specimen.  However, the marking pen did not work too  well, so I used the knife to outline the cone after I had injected the  cervix with a dilute solution of Pitressin.  I used 15 mL of a solution  mixed with 10 units of Pitressin and 120 mL of saline, and was very  difficult to get adequate exposure, but with a combination of using  single-tooth tenaculi, Allis clamps and pickups with teeth, I was able  to outline the entire specimen and then because of the huge size of the  lesion, if I had done an actual conization, I would remove her entire  cervix, so I tried to make more of a cap than a cone.  The endocervix  was of course visible all the way out towards the vaginal fornices, so I  tried to stay extremely shallow with the cone.  After I had removed the  cone,  there was some asymmetry to the endocervical submucosa, so I  removed 3 additional pieces of tissue that were in the submucosa and  probably did not in any way involved the lesion just to make the  submucosal bed more symmetrical.  I did speak to the pathologist and  told him what the tissue was.  I then did an endocervical and finally an  endometrial curette.  Because the lesion extended so close to the  posterior cul-de-sac, I had very little vaginal tissue posteriorly and  no cervical tissue really to reapproximate.  I tried to get complete  hemostasis with a combination of the Bovie and with several interrupted  figure-of-eight sutures on the posterior vaginal tissue.  I then  reconstructed the cervix by using sutures at 12, 3, and 9 o'clock.  There was very minimal bleeding.  At this point, I used Monsel's to get  complete hemostasis, then visualized the right vulvar lesion, removed it  with a knife blade and  reapproximated the skin with interrupted 3-0  plain catgut.  The patient seemed to tolerate  the procedure well.  Blood loss, of course, because of the difficulty  with the exposure related to the extremely large lesion was more than  normal, probably about 200 mL.  Sponge and needle counts were correct  and the patient was returned to recovery in satisfactory condition.       Malachi Pro. Ambrose Mantle, M.D.  Electronically Signed     TFH/MEDQ  D:  02/19/2009  T:  02/19/2009  Job:  [id number]

## 2010-12-09 NOTE — H&P (Signed)
Madison, Matthews                ACCOUNT NO.:  [id number]   MEDICAL RECORD NO.:  [id number]          PATIENT TYPE:  INP   LOCATION:  5127                         FACILITY:  [facility]   PHYSICIAN:  Velora Heckler, MD      DATE OF BIRTH:  Sep 30, 1980   DATE OF ADMISSION:  06/12/2008  DATE OF DISCHARGE:                              HISTORY & PHYSICAL   CHIEF COMPLAINT:  Right-sided abdominal pain.   HISTORY OF PRESENT ILLNESS:  Madison Matthews is a 30 year old female patient  who reports a 3-day history of intermittent right-sided abdominal pain,  progressive in nature, becoming more constant and severe, especially the  past 24 hours.  The pain has now become more focal in the right lower  quadrant radiating to the right flank.  This was associated with nausea  and vomiting.  Because of these symptoms, the patient presented to the  [facility] in [facility].  Workup was initiated.  She was found to  have leukocytosis in the 20,000 range.  Pain consistent with possible  appendicitis.  CT of the abdomen and pelvis did confirm acute  nonperforated appendicitis.  The patient has been transfer to [facility]  subsequently for intraoperative treatment.   REVIEW OF SYSTEMS:  As per the history of present illness.  Otherwise,  all review of systems categories negative at this point.   SOCIAL HISTORY:  The patient smokes 1 pack of cigarettes a day.  Denies  alcohol use.  She is currently unemployed.   ALLERGIES:  PREDNISONE, which causes hives.   CURRENT MEDICATIONS:  None.   PAST MEDICAL HISTORY:  The patient denies.   PAST SURGICAL HISTORY:  1. D and C in 2008 for fetal demise.  2. Laser vaporization of vulvar condylomata in May 2009.   PHYSICAL EXAMINATION:  GENERAL:  Pleasant female patient currently  complaining of right-sided abdominal pain positioned on her left side.  VITAL SIGNS:  Temperature 98.5, BP 97/52, pulse 69 and regular,  respirations 18.  PSYCHIATRIC:  The patient is alert  and oriented x3.  Affect appropriate  to current situation.  NEURO:  Cranial nerves II through XII are grossly intact.  Moving all  extremities x4.  No focal or neurological deficits.  ENT:  Ears are symmetrical.  Nose is midline without rhinorrhea.  Oral  mucous membranes are pink and dry.  CHEST:  Bilateral lung sounds are clear to auscultation.  Respiratory  effort is nonlabored.  She is sating 97% on room air.  CARDIOVASCULAR:  Heart sounds S1 and S2 without obvious rubs, murmurs,  clicks, or gallops.  No JVD.  No peripheral edema.  Heart rate is not  tachycardic.  ABDOMEN:  Obese but soft.  Bowel sounds are present.  She is tender in  the right lower quadrant at McBurney point with minimal voluntary  guarding.  EXTREMITIES:  Symmetrical in appearance without cyanosis or clubbing.   LABORATORY DATA:  Sodium 137, potassium 3.4, CO2 23, glucose 103, BUN 9,  creatinine 0.9.  LFTs are normal.  White count 20,400 with a neutrophil  count of 67%,  hemoglobin 40.6, platelets 210,000.  Diagnostic CT of the  abdomen and pelvis again shows severe acute appendicitis with extensive  inflammatory change with tracking to the right lateral coronal fascia.   IMPRESSION:  Severe appendicitis.   PLAN:  1. Admit the patient as noted n.p.o. status and for planned operative      intervention today.  2. IV fluids for hydration.  3. Resume Unasyn.  The patient received a dose around 4 a.m. at [facility].  4. Symptom management with pain meds and antiemetics.  5. Plan operative intervention today with laparoscopic, possible open      appendectomy.  Risks and benefits discussed with the patient per      Dr. Gerrit Friends.      Allison L. Rennis Harding, N.P.      Velora Heckler, MD  Electronically Signed    ALE/MEDQ  D:  06/12/2008  T:  06/12/2008  Job:  [id number]

## 2010-12-09 NOTE — Op Note (Signed)
Madison Matthews, Madison Matthews                ACCOUNT NO.:  [id number]   MEDICAL RECORD NO.:  [id number]          PATIENT TYPE:  INP   LOCATION:  5127                         FACILITY:  [facility]   PHYSICIAN:  Velora Heckler, MD      DATE OF BIRTH:  1981/07/07   DATE OF PROCEDURE:  06/12/2008  DATE OF DISCHARGE:                               OPERATIVE REPORT   PREOPERATIVE DIAGNOSIS:  Acute appendicitis.   POSTOPERATIVE DIAGNOSIS:  Perforated appendicitis.   PROCEDURE:  Laparoscopic appendectomy with drainage.   SURGEON:  Velora Heckler, MD, FACS   ASSISTANT:  Letha Cape, PA   ANESTHESIA:  General.   ESTIMATED BLOOD LOSS:  Minimal.   PREPARATION:  Betadine.   COMPLICATIONS:  None.   INDICATIONS:  The patient is a 30 year old white female who presented to  the [facility] for evaluation with a 3-day history of  abdominal pain.  Emergency room physician assessed the patient.  She was  noted to have an elevated white blood cell count of 20,400 with a left  shift.  The patient underwent CT scan of the abdomen and pelvis  demonstrating findings consistent with acute appendicitis.  The patient  is now prepared and brought to the operating room at [facility]  for appendectomy.   BODY OF REPORT:  Procedure was done in OR #70 at the [facility].  The patient was brought to the operating room and  placed in a supine position on the operating room table.  Following  administration of general anesthesia, the patient was prepped and draped  in the usual strict aseptic fashion.  After ascertaining that an  adequate level of anesthesia had been achieved, an infraumbilical  incision was made to the midline with a #15 blade.  Dissection was  carried down to the fascia.  Fascia was incised in the midline.  The  peritoneal cavity was entered cautiously.  A 0-Vicryl purse-string  suture was placed in the fascia.  An Hasson cannula was introduced under  direct vision and secured with a purse-string suture.  The abdomen was  insufflated with carbon dioxide.  Laparoscope was introduced and the  abdomen explored.  Operative ports were placed in the right upper  quadrant and left lower quadrant.  There was an inflammatory mass  involving the cecum and the lateral wall of the ascending colon.  With  gentle blunt dissection, the plane was developed.  Using the harmonic  scalpel, the colic gutter was opened.  A markedly inflamed almost  necrotic-appearing appendix was visualized.  Mesoappendix was dissected  away from the cecal wall and divided with the harmonic scalpel.  Base of  the appendix was then followed to the cecal wall.  There was a  perforation in the proximal third of the appendix.  There are 3 separate  fecaliths which have fallen into the cavity between the ascending colon  and the lateral abdominal wall through the perforation.  The appendiceal  stump is gently dissected out and freed circumferentially.  Using an  Endo-GIA stapler.  The  appendiceal stump was transected with adequate  closure along the cecal wall.  The appendix and the fecaliths are  retrieved and placed into an EndoCatch bag and withdrawn through the  umbilical port and submitted to pathology for review.   Right colic gutter and pelvis were irrigated copiously with warm saline  which was evacuated.  Staple line was inspected.  It appears intact.  Tissue appears viable.  There is no leakage of fecal content.  However,  due to the degree of inflammation along the cecum and lateral ascending  colonic wall, a 19-French Blake drain was brought in from the right  upper quadrant port and placed along the right colic gutter and around  the base of the cecum in case there was breakdown of the appendiceal  stump.  The drain was secured to the skin with 2-0 nylon suture.  Remainder of the abdomen was irrigated and fluid evacuated.  Good  hemostasis was noted.  Ports were  removed.  Zero Vicryl purse-string  sutures were tied securely.  Port sites were anesthetized with local  anesthetic.  Drain was placed to bulb suction.  Wounds were closed with  interrupted 4-0 Vicryl subcuticular sutures.  Wounds were washed and  dried and benzoin and Steri-Strips were applied.  Sterile dressings were  applied.  The patient was awakened from anesthesia and brought to the  recovery room in stable condition.  The patient tolerated the procedure  well.      Velora Heckler, MD  Electronically Signed     TMG/MEDQ  D:  06/12/2008  T:  06/13/2008  Job:  [id number]

## 2010-12-09 NOTE — Op Note (Signed)
NAMEJAHNI, PAUL                ACCOUNT NO.:  [id number]   MEDICAL RECORD NO.:  [id number]          PATIENT TYPE:  AMB   LOCATION:  [facility]                         FACILITY:  [facility]   PHYSICIAN:  Malachi Pro. Ambrose Mantle, M.D. 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   OPERATION:  Laser vaporization of multiple condyloma.   OPERATOR:  Malachi Pro. Ambrose Mantle, M.D.   ANESTHESIA:  General.   The patient was brought to the operating room and placed under  satisfactory general anesthesia.  She was placed in the lithotomy  position. Exam revealed at least 9 or 10 condyloma on the vulva.  There  were also 2 condyloma perianally that had not been recognized earlier.  One was just posterior to the anus, one was actually in the anal canal  at about 10 o'clock.  The area was prepped with acetic acid 5% and then,  with the microscope, I studied all the condyloma.  I started at the top  and worked down and decided on a spot size of 3 to maximize the  vaporization. I used iced saline as I progressed through the procedure  to try to minimize the patient's discomfort.  I laser vaporized with the  setting at 3 of all the condyloma. I did place a wet sponge into the  patient's rectum and vaporized the 2 perianal condyloma. I then brushed  the vulva with a defocused beam and then terminated the procedure.  At  the end of the procedure, all the condyloma had been successfully  vaporized and there did not seem to be any significant damage to the  patient's dermis. The patient was then returned to recovery in  satisfactory condition.      Malachi Pro. Ambrose Mantle, M.D.  Electronically Signed     TFH/MEDQ  D:   12/16/2007  T:  12/16/2007  Job:  [id number]

## 2010-12-09 NOTE — Op Note (Signed)
Madison Matthews, MALAN                ACCOUNT NO.:  [id number]   MEDICAL RECORD NO.:  [id number]          PATIENT TYPE:  AMB   LOCATION:  SDC                           FACILITY:  [facility]   PHYSICIAN:  Malachi Pro. Ambrose Mantle, M.D. DATE OF BIRTH:  01/08/1981   DATE OF PROCEDURE:  04/05/2007  DATE OF DISCHARGE:                               OPERATIVE REPORT   PREOPERATIVE DIAGNOSES:  1. Intrauterine pregnancy at 9 weeks 4 days.  2. Fetal demise.  3. Probable cystic hygroma.   POSTOPERATIVE DIAGNOSES:  1. Intrauterine pregnancy at 9 weeks 4 days.  2. Fetal demise.  3. Probable cystic hygroma.   OPERATION:  Suction D&C.   OPERATOR:  Malachi Pro. Ambrose Mantle, M.D.   ANESTHESIA:  General anesthesia and also local anesthesia.   PROCEDURE:  The patient was brought to the operating room and placed  under satisfactory general anesthesia.  She was noted to have, what  appeared to be, condyloma on the vulva.  The vulva, vagina, perineum and  urethra were prepped with Betadine solution.  The bladder was emptied  with a Foley catheter.  The area was draped as a sterile field.  The  cervix was exposed and drawn into the operative field with a tenaculum.  The uterus sounded to initially 11 cm and subsequently to 12.5 cm  anteriorly.  The cervix was dilated up with Shawnie Pons dilators until a #9  curved suction curette would slide into the endometrial cavity.   Suction D&C was then begun.  There was a significant amount of blood  loss.  By the end of the procedure there was estimated to be about 300  mL.  Some of this could have been amniotic fluid.   The curettage was done in systematic circuits throughout the endometrial  cavity.  Pitocin was begun; this caused the uterus to shrink somewhat in  size.  I did a sharp curettage to see if there was any suggestion that  any tissue remained.  The last 2 or 3 circuits I made with the suction  instrument did not produce any tissue.   The procedure was then terminated.   I waited until I had hemostasis at  the tenaculum sites, and the patient was returned to recovery in  satisfactory condition.  At the time of the prep it did appear that the  patient might have a slight yellowish discharge in the cervix; so I am  going to treat her with doxycycline for 3 days postoperatively.      Malachi Pro. Ambrose Mantle, M.D.  Electronically Signed     TFH/MEDQ  D:  04/05/2007  T:  04/05/2007  Job:  [id number]

## 2010-12-09 NOTE — Discharge Summary (Signed)
NAMECARRYE, GOLLER                ACCOUNT NO.:  [id number]   MEDICAL RECORD NO.:  [id number]          PATIENT TYPE:  INP   LOCATION:  5127                         FACILITY:  [facility]   PHYSICIAN:  Velora Heckler, MD      DATE OF BIRTH:  22-Oct-1980   DATE OF ADMISSION:  06/12/2008  DATE OF DISCHARGE:  06/15/2008                               DISCHARGE SUMMARY   PROCEDURE:  Laparoscopic appendectomy with a Jackson-Pratt drain left in  place was performed on June 12, 2008, by Dr. Gerrit Friends.   There were no consultants.   REASON FOR ADMISSION:  Ms. Mcadams is a 30 year old female who presents  with a 3-day history of intermittent right-sided abdominal pain, which  was progressive in nature and becoming more constant and severe.  Her  pain is becoming more focal in the right lower quadrant and the right  flank.  She has had some nausea and vomiting.  At the time of presenting  to the Emergency Department, she was found to have a white count of  20,000 and a CT scan which did confirm acute nonperforated appendicitis.  At that time, the patient was transferred to [facility] from the [facility]  ER at [facility] for operative management.  Please see admitting  history and physical for further details.   ADMITTING DIAGNOSIS:  Severe appendicitis.   HOSPITAL COURSE:  At this time, the patient was admitted and started on  Unasyn and later in the day, she was taken down to the OR where a  laparoscopic appendectomy was performed.  During this procedure, the  patient was found to have a perforated appendix.  At this time, due to  the amount of infection and inflammation after the appendix was removed,  a JP drain was left in place.  Otherwise, the patient tolerated this  procedure well.  On postoperative day 1, the patient was complaining of  abdominal pain, however, at this time was already tolerating a regular  diet.  On exam, her abdomen was soft, very tender in the right lower  quadrant with her  JP drain showing serosanguineous drainage.  At this  time, her diet was continued and she was changed to p.o. pain medicines  and encouraged to ambulate.  By postoperative day 2, the patient was,  otherwise, doing fairly well except for she has spiked a fever of 102.5.  At this time, we felt the patient needed to stay for more IV  antibiotics.  By postoperative day 3, the patient has been afebrile for  24 hours and a new CBC was obtained which showed white count beyond the  5800.  At this time, the patient was doing better.  She was no longer  having any nausea and continuing to tolerate a regular diet.  At this  time, she was ready to go home.  On exam, her JP drain was a little  concerning with serosanguineous draining turning to cloudy  serosanguineous drainage.  Therefore, at this time, her drain is left in  place and she is informed that she will need to follow up with [place]  this  following Tuesday for continuing management of her drain.  Otherwise, at  this time, the patient was felt stable for discharge and was sent home  today.   DISCHARGE DIAGNOSES:  1. Perforated appendicitis.  2. Status post laparoscopic appendectomy with placement of a Al Pimple drain.   DISCHARGE MEDICATIONS:  1. The patient is not on any home medications, but given prescriptions      for Percocet 5/325 one to two p.o. q.4 h. p.r.n. pain.  2. Augmentin 875 mg 1 p.o. b.i.d. for 7 days.  3. Any over-the-counter stool softener as needed for constipation.   DISCHARGE INSTRUCTIONS:  The patient is informed that she may return to  work in 2 weeks.  She does not have any dietary restrictions.  She is  told to increase her activity slowly and she may walk up steps.  Otherwise, at this time, she is told that she may not shower or bathe  until her JP drain is removed.  Otherwise, in the meantime, she can just  sponge bathe.  She is not to lift anything greater than approximately 10-  15 pounds for the next  2 weeks and she is not to drive while taking any  narcotic pain medicine.  Otherwise, prior to discharge, the patient is  taught how to empty and restart her drain and she is told to continue  this at home and record her drainage output and bring it with her to her  next appointment.  This appointment is scheduled at our office for the  [facility] on Tuesday, June 19, 2008, at 3:15 and she is to arrive  at 3 p.m.  At this time, if her JP drain output appears to be normal and  continues to be serosanguineous with no cloudiness to it, then her JP  drain will probably be discontinued at this time.  Otherwise, if her  drainage output is still concerning, a CT scan may be ordered to further  assess what may be going on in the patient's pelvis.  Otherwise, the  patient is informed that if she begins to get a fever greater than  101.5, worsening abdominal pain, or pus-like drainage into her JP drain  or stool-like drainage into her JP drain, she is to call our office  prior to her appointment.      Letha Cape, PA      Velora Heckler, MD  Electronically Signed    KEO/MEDQ  D:  06/15/2008  T:  06/16/2008  Job:  [id number]

## 2010-12-12 NOTE — H&P (Signed)
Madison Matthews, Madison Matthews                ACCOUNT NO.:  [id number]   MEDICAL RECORD NO.:  [id number]           PATIENT TYPE:   LOCATION:                                 FACILITY:   PHYSICIAN:  James A. Ashley Royalty, M.D.     DATE OF BIRTH:   DATE OF ADMISSION:  DATE OF DISCHARGE:                                HISTORY & PHYSICAL   This is a 30 year old gravida 1, para 1 first seen by me on or about July 29, 2004 having been referred from [facility] for evaluation of  pelvic pain.  Her history dates back to June 28, 2004 at which time she  presented to [facility] complaining of right lower quadrant discomfort  which radiated around her right hip and down the posterior compartment of  her right thigh.  She had a CT scan performed at [facility] which  showed a right hydrosalpinx and normal uterus and ovaries.  The process was  felt to be chronic at that time.  She returned to [facility] on or  about July 08, 2004 for continued discomfort.  She states she received  another CT scan at that time which revealed near complete resolution of the  right-sided hydropyosalpinx since the prior study.  There were no other  findings.  She stated after the original visit at [facility] she was given  an antibiotic, perhaps Levaquin as well as some analgesic medication.   The patient presented to me July 29, 2004 stating that periods were  regular.  The last normal menstrual period was June 20, 2004.  She also  had a period July 15, 2004 but it was mostly brown, which was not  particularly normal for her.  She stated that she had had no sex over the  last year except for July 26, 2004 which was unprotected.  She denied  any anorexia, nausea and vomiting, fever, history of gonorrhea or Chlamydia.  She also denied any history of substantial dysmenorrhea or infertility.  She  does have occasional dyspareunia.  GC and Chlamydia culture was done at  [facility] and was negative.  The patient presented to me again October 16, 2004 stating that she continued to have lower abdominal/pelvic discomfort.  She had a period approximately October 10, 2004.  She stated she felt the  discomfort was sufficiently debilitating to warrant surgical intervention.   MEDICATIONS:  None at present.   PAST MEDICAL HISTORY:  Medical:  Negative.  Surgical:  Cyst on wrist in 1984.   ALLERGIES:  No known drug allergies.   FAMILY HISTORY:  Positive for hypertension, diabetes, and numerous female  cancers including uterine, breast and ovarian.   SOCIAL HISTORY:  The patient denies the use of alcohol.  She is a smoker.   REVIEW OF SYSTEMS:  Noncontributory.   PHYSICAL EXAMINATION:  GENERAL:  A well-developed, well-nourished, pleasant,  somewhat obese white female in no acute distress.  VITAL SIGNS:  Afebrile with vital signs stable.  SKIN:  Warm and dry without lesions.  LYMPH NODES:  There is  no supraclavicular, cervical or inguinal adenopathy.  HEENT:  Normocephalic.  NECK:  Supple without difficulty.  CHEST/LUNGS:  Clear.  CARDIAC:  A regular rate and rhythm without murmurs, gallops or rubs.  ABDOMEN:  Soft and minimally tender, (direct), in the right lower quadrant  area.  There is no rebound tenderness or peritoneal signs.  No organomegaly.  Bowel sounds are active.  MUSCULOSKELETAL:  No CVA tenderness.  PELVIC:  External genitalia within normal limits.  Vagina and cervix without  gross lesions.  Bimanual examination reveals the uterus to be approximately  9 x 5 x 4 cm and no adnexal masses are palpable.   History and physical gleamed from the patient's most recent hospital  encounter.   IMPRESSION:  1.  History of right hydrosalpinx on CT scan at [facility] showing      interval decrease in size over serial studies.  2.  Pelvic pain and dyspareunia - etiology uncertain.  Differential includes      endometriosis, adhesions,  primary  gastrointestinal, musculoskeletal      adnexal pathology.   PLAN:  Diagnostic/operative laparoscopy.  Risks, benefits, complications and  alternatives fully discussed with the patient.  The possibility of  unilateral salpingo-oophorectomy discussed and accepted.  The possibility of  exploratory laparotomy discussed and accepted.  Questions invited and  answered.      JAM/MEDQ  D:  10/29/2004  T:  10/29/2004  Job:  [id number]

## 2010-12-12 NOTE — Op Note (Signed)
Madison Matthews, Madison Matthews                ACCOUNT NO.:  [id number]   MEDICAL RECORD NO.:  [id number]          PATIENT TYPE:  AMB   LOCATION:  SDC                           FACILITY:  [facility]   PHYSICIAN:  James A. Ashley Royalty, M.D.DATE OF BIRTH:  [birth year]   DATE OF PROCEDURE:  10/29/2004  DATE OF DISCHARGE:                                 OPERATIVE REPORT   PREOPERATIVE DIAGNOSIS:  Pelvic pain.   POSTOPERATIVE DIAGNOSES:  1.  Right hydrosalpinx.  2.  Pelvic adhesions.   PROCEDURES:  1.  Diagnostic/operative laparoscopy.  2.  Right salpingectomy.  3.  Chromopertubation of the oviduct (left).   SURGEON:  Rudy Jew. Ashley Royalty, M.D.   ANESTHESIA:  General.   ESTIMATED BLOOD LOSS:  50 mL.   COMPLICATIONS:  None.   PACKS AND DRAINS:  None.   PROCEDURE:  The patient was taken to the operating room and placed in the  dorsal supine position, where general anesthesia was administered.  She was  placed in the lithotomy position and prepped and draped in the usual manner  for abdominal and vaginal surgery.  A posterior weighted retractor was  placed per vagina.  The anterior lip of the cervix was grasped with a single-  tooth tenaculum.  A Jarcho uterine manipulator was placed per cervix and  held in place with a tenaculum.  A Foley catheter was placed.  The patient  had PAS stockings on.   After the draping was complete, the 1.2 cm infraumbilical incision was made  in the longitudinal plane.  A Veress needle was inserted into the abdominal  cavity.  Its location was verified by instillation of saline and hanging  drop techniques.  Three liters of CO2 were instilled at 1 L/min.  The size  10/11 disposable laparoscopic trocar was then placed in the cavity.  Its  location was verified by placement of the laparoscope.  There was no  evidence of any trauma.  Next, 5 mm suprapubic trocars were placed in the  left and right lower quadrants, respectively.  Transillumination and direct  visualization  techniques were employed.   Exploration of the pelvis revealed immediately the uterus to be normal size,  shape and contour.  However, there were numerous filmy adhesions to the left  adnexa immediately noted.  In addition, the right adnexa was noted to  contain a hydrosalpinx.  The right ovary was not immediately visualized at  all.  The left fallopian tube appeared to be quite tortuous and not seen in  its entirety initially.  The operator could see some fimbriae.  The left  ovary was normal size, shape and contour with numerous filmy adhesions to  the left fallopian tube and uterus.  The posterior cul-de-sac appeared to be  relatively clean.  The distal aspect of the right fallopian tube could not  be visualized at all.  Appropriate photos were obtained.  At this point a  decision was made to attempt to lyse the adhesions which prevented full  access to the right adnexa.  Using the monopolar scissors and the Nezhat  suction irrigator with gentle  traction, most of the adhesions were  successfully lysed.  The hydrosalpinx was quite __________, and a decision  was made to go ahead and remove the right fallopian tube.  The tripolar  cautery was employed in order to do this.  The right mesosalpinx was  coagulated and incised in increments in order to release the right fallopian  tube at the level of the uterine cornu.  Bipolar cautery was used to seal  this area and hopefully avoid any future ectopic on that side.  The specimen  was placed in the cul-de-sac for later retrieval.  At this point the right  ovary could be visualized without difficulty.  It appeared to be normal  size, shape and contour with an apparent stigma present.  The ureter was  noted to be well below the plane of dissection.  Hemostasis was excellent  throughout.  Copious irrigation was accomplished.  Hemostasis was noted.  Next, attention was turned to the filmy adhesions of the left adnexa.  These  were lysed for the  most part with monopolar scissors in coagulation wave  form.   As the dissection on the left side concluded, the operator instilled some  dilute indigo carmine via the Jarcho uterine manipulator.  It spilled with  perhaps some modest delay on the left side.  It did not spill at all through  the right cornual stub.  The patient did receive intraoperative dose of  Ancef.   At this point, the patient was felt to have benefitted maximally from the  surgical procedure.  The abdominal instruments were removed and the  pneumoperitoneum evacuated.  The fascial defects were closed with 0 Vicryl  in an interrupted fashion.  The skin was closed with 3-0 Monocryl in a  subcuticular fashion.  The vaginal instruments were removed and hemostasis  was noted.  The patient was sent to the recovery room in excellent  condition.  The scrub nurses were instructed to remove the Foley at their  discretion.      JAM/MEDQ  D:  10/29/2004  T:  10/29/2004  Job:  [id number]

## 2010-12-30 ENCOUNTER — Emergency Department (INDEPENDENT_AMBULATORY_CARE_PROVIDER_SITE_OTHER): Payer: Medicaid Other

## 2010-12-30 ENCOUNTER — Emergency Department (HOSPITAL_BASED_OUTPATIENT_CLINIC_OR_DEPARTMENT_OTHER)
Admission: EM | Admit: 2010-12-30 | Discharge: 2010-12-30 | Disposition: A | Discharge status: Home / Self Care | Payer: Medicaid Other | Attending: Emergency Medicine | Admitting: Emergency Medicine

## 2010-12-30 DIAGNOSIS — M25469 Effusion, unspecified knee: Secondary | ICD-10-CM | POA: Insufficient documentation

## 2010-12-30 DIAGNOSIS — M25569 Pain in unspecified knee: Secondary | ICD-10-CM

## 2010-12-30 DIAGNOSIS — F319 Bipolar disorder, unspecified: Secondary | ICD-10-CM | POA: Insufficient documentation

## 2011-01-01 ENCOUNTER — Encounter: Payer: Self-pay | Admitting: Family Medicine

## 2011-01-01 ENCOUNTER — Ambulatory Visit (INDEPENDENT_AMBULATORY_CARE_PROVIDER_SITE_OTHER): Payer: Medicaid Other | Admitting: Family Medicine

## 2011-01-01 VITALS — BP 122/83 | HR 97 | Temp 98.7°F | Ht 68.0 in | Wt 230.6 lb

## 2011-01-01 DIAGNOSIS — M25569 Pain in unspecified knee: Secondary | ICD-10-CM

## 2011-01-01 DIAGNOSIS — M25561 Pain in right knee: Secondary | ICD-10-CM | POA: Insufficient documentation

## 2011-01-01 MED ORDER — MELOXICAM 15 MG PO TABS: 15.0000 mg | ORAL_TABLET | Freq: Every day | ORAL | Status: DC

## 2011-01-01 MED ORDER — MELOXICAM 15 MG PO TABS: 15.0000 mg | ORAL_TABLET | Freq: Every day | ORAL | Status: AC

## 2011-01-01 NOTE — Patient Instructions (Addendum)
Your exam is most consistent with three main possibilities: patellofemoral syndrome (kneecap tracking issue), medial meniscus tear (cartilage), synovitis (inflammation in the lining of the joint). Synovitis can occur on its own or in combination with either of the other two conditions. All of the above are treated the same initially with the following: Ice knee 15 minutes at a time 3-4 times a day. Meloxicam 15mg  daily with food for pain and inflammation. Knee brace or ACE wrap for both swelling and stability. The cortisone injection typically starts working after a few days but may take 1-2 weeks. Start physical therapy for strengthening and modalities - they have protocols in place for patellofemoral syndrome and meniscal tears. Follow up with me in 6 weeks (or in 2 weeks if not improving).

## 2011-01-01 NOTE — Progress Notes (Signed)
  Subjective:    Patient ID: Madison Matthews, female    DOB: 20-Jun-1981, 30 y.o.   MRN: [id number]  HPI  30 yo F here for right knee pain.  Patient denies known injury. States pain started in anteromedial right knee about 2 months ago and has worsened since then. Associated with some swelling but no bruising. No h/o gout or other joint pains No erythema.   No locking or catching. Feels stiff and feels like it wants to give out. No prior surgeries. Went to ED and had x-rays showing mild effusion but no DJD or other abnormalities. She went to [place] [facility] but did not like the care she received there - had aspiration and injection (cortisone) done - not much benefit to this point. Taking ibuprofen, intermittent icing. Also given percocet in ED.  History reviewed. No pertinent past medical history.  No current outpatient prescriptions on file prior to visit.    History reviewed. No pertinent past surgical history.  Allergies  Allergen Reactions  . Prednisone     History   Social History  . Marital Status: Single    Spouse Name: N/A    Number of Children: N/A  . Years of Education: N/A   Occupational History  . Not on file.   Social History Main Topics  . Smoking status: Current Everyday Smoker -- 1.0 packs/day    Types: Cigarettes  . Smokeless tobacco: Not on file  . Alcohol Use: Not on file  . Drug Use: Not on file  . Sexually Active: Not on file   Other Topics Concern  . Not on file   Social History Narrative  . No narrative on file    Family History  Problem Relation Age of Onset  . Hypertension Mother   . Hypertension Father   . Heart attack Paternal Grandmother   . Diabetes Paternal Grandfather     BP 122/83  Pulse 97  Temp(Src) 98.7 F (37.1 C) (Oral)  Ht 5\' 8"  (1.727 m)  Wt 230 lb 9.6 oz (104.599 kg)  BMI 35.06 kg/m2  LMP 12/16/2010  Review of Systems See HPI above.    Objective:   Physical Exam  Gen: NAD R knee: Minimal  swelling.  No warmth, bruising, or erythema. TTP medial joint line and posterior patellar facets.  No lateral joint line, patellar tendon, pes, or other TTP. ROM 0 - 100 degrees but pain at the extents of motion. Stable to valgus and varus stress.  Negative ant/post drawers.  Negative lachmanns Mcmurrays causes pain medially but no click. Negative apprehension.  + patellar grind. NVI distally.    L knee: FROM without pain, swelling, instability.  Assessment & Plan:  1. R knee pain - reviewed radiographs - mild effusion but no evidence of DJD.  Her exam and history would be most consistent with patellofemoral syndrome with synovitis though medial tenderness and pain with mcmurrays suggests possible meniscal tear.  No known trauma however and young to have a degenerative meniscal tear.  Start with conservative care - has already been given cortisone injection yesterday.  Add mobic, icing, physical therapy.  If she is not improving over the course of the next 2 weeks, advised her to return and we will consider MRI at that time to rule out medial meniscal tear.  See instructions for further.

## 2011-01-01 NOTE — Assessment & Plan Note (Signed)
reviewed radiographs - mild effusion but no evidence of DJD.  Her exam and history would be most consistent with patellofemoral syndrome with synovitis though medial tenderness and pain with mcmurrays suggests possible meniscal tear.  No known trauma however and young to have a degenerative meniscal tear.  Start with conservative care - has already been given cortisone injection yesterday.  Add mobic, icing, physical therapy.  If she is not improving over the course of the next 2 weeks, advised her to return and we will consider MRI at that time to rule out medial meniscal tear.  See instructions for further.

## 2011-01-05 ENCOUNTER — Ambulatory Visit: Payer: Medicaid Other | Attending: Family Medicine | Admitting: Physical Therapy

## 2011-01-05 DIAGNOSIS — M25569 Pain in unspecified knee: Secondary | ICD-10-CM | POA: Insufficient documentation

## 2011-01-05 DIAGNOSIS — M25669 Stiffness of unspecified knee, not elsewhere classified: Secondary | ICD-10-CM | POA: Insufficient documentation

## 2011-01-05 DIAGNOSIS — IMO0001 Reserved for inherently not codable concepts without codable children: Secondary | ICD-10-CM | POA: Insufficient documentation

## 2011-01-19 ENCOUNTER — Encounter: Payer: Medicaid Other | Admitting: Physical Therapy

## 2011-01-22 ENCOUNTER — Ambulatory Visit (INDEPENDENT_AMBULATORY_CARE_PROVIDER_SITE_OTHER): Payer: Medicaid Other | Admitting: Family Medicine

## 2011-01-22 ENCOUNTER — Encounter: Payer: Self-pay | Admitting: Family Medicine

## 2011-01-22 VITALS — BP 129/89 | HR 96 | Temp 98.5°F | Ht 69.0 in | Wt 223.2 lb

## 2011-01-22 DIAGNOSIS — M25569 Pain in unspecified knee: Secondary | ICD-10-CM

## 2011-01-22 DIAGNOSIS — M25561 Pain in right knee: Secondary | ICD-10-CM

## 2011-01-22 NOTE — Assessment & Plan Note (Signed)
has not improved as expected with home exercises, mobic, icing, knee brace, cortisone injection from about 4 weeks ago.  Would expect with small degenerative meniscal tear, patellofemoral syndrome, or synovitis that she would have more improvement with these to this point.  Does have medial joint line tenderness with pain on meniscal testing.  Will move forward with MRI to further assess for medial meniscal tear and call patient with results.

## 2011-01-22 NOTE — Progress Notes (Signed)
Subjective:    Patient ID: Madison Matthews, female    DOB: 02-27-1981, 30 y.o.   MRN: [id number]  HPI   30 yo F here for 3 week f/u right knee pain.  6/7: Patient denies known injury. States pain started in anteromedial right knee about 2 months ago and has worsened since then. Associated with some swelling but no bruising. No h/o gout or other joint pains No erythema.   No locking or catching. Feels stiff and feels like it wants to give out. No prior surgeries. Went to ED and had x-rays showing mild effusion but no DJD or other abnormalities. She went to [facility] but did not like the care she received there - had aspiration and injection (cortisone) done - not much benefit to this point. Taking ibuprofen, intermittent icing. Also given percocet in ED.  Today: Patient went to 1 PT visit (only allowed 3 by her insurance so they wanted to wait in case she needed PT after a surgical intervention) and learned HEP which she has been doing regularly past 3 weeks. Exercises are painful and has not improved. Had mild improvement for a few days with cortisone injection. Is taking mobic, using knee brace, icing also without much help. Still feels unstable, stiff, crunches/catches.  No true locking.  History reviewed. No pertinent past medical history.  Current Outpatient Prescriptions on File Prior to Visit  Medication Sig Dispense Refill  . meloxicam (MOBIC) 15 MG tablet Take 1 tablet (15 mg total) by mouth daily.  30 tablet  2    History reviewed. No pertinent past surgical history.  Allergies  Allergen Reactions  . Prednisone     History   Social History  . Marital Status: Single    Spouse Name: N/A    Number of Children: N/A  . Years of Education: N/A   Occupational History  . Not on file.   Social History Main Topics  . Smoking status: Current Everyday Smoker -- 1.0 packs/day    Types: Cigarettes  . Smokeless tobacco: Not on file  . Alcohol Use: Not on  file  . Drug Use: Not on file  . Sexually Active: Not on file   Other Topics Concern  . Not on file   Social History Narrative  . No narrative on file    Family History  Problem Relation Age of Onset  . Hypertension Mother   . Hypertension Father   . Heart attack Paternal Grandmother   . Diabetes Paternal Grandfather     BP 129/89  Pulse 96  Temp(Src) 98.5 F (36.9 C) (Oral)  Ht 5\' 9"  (1.753 m)  Wt 223 lb 3.2 oz (101.243 kg)  BMI 32.96 kg/m2  LMP 12/16/2010  Review of Systems  See HPI above.    Objective:   Physical Exam  Gen: NAD R knee: No swelling.  No warmth, bruising, or erythema. TTP medial joint line and posterior patellar facets.  Minimal lateral joint line TTP.  No patellar tendon, pes, or other TTP. ROM 0 - 110 degrees but pain at the extents of motion. Stable to valgus and varus stress.  Negative ant/post drawers.  Negative lachmanns Mcmurrays causes pain medially but no click. Negative apprehension.  + patellar grind. NVI distally.    L knee: FROM without pain, swelling, instability.  Assessment & Plan:  1. R knee pain - has not improved as expected with home exercises, mobic, icing, knee brace, cortisone injection from about 4 weeks ago.  Would expect with  small degenerative meniscal tear, patellofemoral syndrome, or synovitis that she would have more improvement with these to this point.  Does have medial joint line tenderness with pain on meniscal testing.  Will move forward with MRI to further assess for medial meniscal tear and call patient with results.

## 2011-02-05 ENCOUNTER — Telehealth: Payer: Self-pay | Admitting: Family Medicine

## 2011-02-05 NOTE — Telephone Encounter (Signed)
Spoke with patient on 7/9 regarding her MRI prior authorization.  This was denied because patient needs to complete at least 6 weeks of home exercise program or PT.  She has been compliant with her home program but only for 4 weeks.  Advised her to return for reevaluation when she is at least 6 weeks out from her initial visit (July 19th) for reevaluation.  Continue with home strengthening program, especially quad strengthening.  Patient agreeable to treatment plan.

## 2011-04-10 ENCOUNTER — Ambulatory Visit (INDEPENDENT_AMBULATORY_CARE_PROVIDER_SITE_OTHER): Payer: Medicaid Other | Admitting: Family Medicine

## 2011-04-10 ENCOUNTER — Encounter: Payer: Self-pay | Admitting: Family Medicine

## 2011-04-10 VITALS — BP 131/84 | HR 99 | Temp 98.6°F | Ht 68.0 in | Wt 215.0 lb

## 2011-04-10 DIAGNOSIS — M25561 Pain in right knee: Secondary | ICD-10-CM

## 2011-04-10 DIAGNOSIS — M25569 Pain in unspecified knee: Secondary | ICD-10-CM

## 2011-04-10 NOTE — Progress Notes (Addendum)
Subjective:    Patient ID: Madison Matthews, female    DOB: [birth year], 30 y.o.   MRN: [id number]  Knee Pain     30 yo F here for f/u right knee pain.  6/7: Patient denies known injury. States pain started in anteromedial right knee about 2 months ago and has worsened since then. Associated with some swelling but no bruising. No h/o gout or other joint pains No erythema.   No locking or catching. Feels stiff and feels like it wants to give out. No prior surgeries. Went to ED and had x-rays showing mild effusion but no DJD or other abnormalities. She went to [facility] but did not like the care she received there - had aspiration and injection (cortisone) done - not much benefit to this point. Taking ibuprofen, intermittent icing. Also given percocet in ED.  6/28: Patient went to 1 PT visit (only allowed 3 by her insurance so they wanted to wait in case she needed PT after a surgical intervention) and learned HEP which she has been doing regularly past 3 weeks. Exercises are painful and has not improved. Had mild improvement for a few days with cortisone injection. Is taking mobic, using knee brace, icing also without much help. Still feels unstable, stiff, crunches/catches.  No true locking.  9/14: Patient has been compliant with daily home exercise program of quad strengthening (sets, straight leg raises) and lunges without benefit for past 3 months. Previously had used ice, mobic, knee brace, ibuprofen, had intraarticular cortisone injection without much help. Did one visit of PT prior to her home exercise program - insurance only covers 3 visits so stuck to home program in case she were to eventually need surgery (she could have PT after surgery if necessary). No true catching or locking. + instability Pain worse with stairs. Some swelling. While walking on stairs felt like her knee was going to give out and something 'popped' and possibly popped back into  place. Pain radiating up thigh now at times.  History reviewed. No pertinent past medical history.  Current Outpatient Prescriptions on File Prior to Visit  Medication Sig Dispense Refill  . meloxicam (MOBIC) 15 MG tablet Take 1 tablet (15 mg total) by mouth daily.  30 tablet  2    History reviewed. No pertinent past surgical history.  Allergies  Allergen Reactions  . Prednisone     History   Social History  . Marital Status: Single    Spouse Name: N/A    Number of Children: N/A  . Years of Education: N/A   Occupational History  . Not on file.   Social History Main Topics  . Smoking status: Current Everyday Smoker -- 1.0 packs/day    Types: Cigarettes  . Smokeless tobacco: Not on file  . Alcohol Use: Not on file  . Drug Use: Not on file  . Sexually Active: Not on file   Other Topics Concern  . Not on file   Social History Narrative  . No narrative on file    Family History  Problem Relation Age of Onset  . Hypertension Mother   . Hypertension Father   . Heart attack Paternal Grandmother   . Diabetes Paternal Grandfather     BP 131/84  Pulse 99  Temp(Src) 98.6 F (37 C) (Oral)  Ht 5\' 8"  (1.727 m)  Wt 215 lb (97.523 kg)  BMI 32.69 kg/m2  Review of Systems  See HPI above.    Objective:   Physical Exam  Gen: NAD R knee: No swelling.  No warmth, bruising, or erythema. TTP medial joint line and medial posterior patellar facet.  Minimal lateral joint line TTP.  No patellar tendon, pes, or other TTP. ROM 0 - 110 degrees but pain at full flexion. Stable to valgus and varus stress.  Negative ant/post drawers.  Negative lachmanns + mcmurrays and apleys medially. Negative apprehension.  + patellar grind. NVI distally.    L knee: FROM without pain, swelling, instability.  Assessment & Plan:  1. R knee pain - s/p 3 months of home exercise program with aggressive quad strengthening, tried mobic and ibuprofen, icing, knee brace, cortisone injection and  still not improving.  Believe this is a medial meniscal tear vs patellofemoral syndrome but would expect more improvement with home exercises if this was just patellofemoral syndrome.  Will move forward with MRI to further assess for medial meniscal tear and call patient with results.    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  Spoke with her regarding findings.  Biggest issue is that her insurance only cover 3 total visits of PT.  I recommended she should do a full 6 week course of PT for this but out of pocket would be too costly in a short period of time.  We discussed surgical options but that these are generally the last line of treatment - she would like to see orthopedist for at least further discussion regarding these.  Will refer to orthopedics.

## 2011-04-10 NOTE — Assessment & Plan Note (Signed)
s/p 3 months of home exercise program with aggressive quad strengthening, tried mobic and ibuprofen, icing, knee brace, cortisone injection and still not improving.  Believe this is a medial meniscal tear vs patellofemoral syndrome but would expect more improvement with home exercises if this was just patellofemoral syndrome.  Will move forward with MRI to further assess for medial meniscal tear and call patient with results.

## 2011-04-11 ENCOUNTER — Ambulatory Visit (HOSPITAL_BASED_OUTPATIENT_CLINIC_OR_DEPARTMENT_OTHER)
Admission: RE | Admit: 2011-04-11 | Discharge: 2011-04-11 | Disposition: A | Discharge status: Home / Self Care | Payer: Medicaid Other | Source: Ambulatory Visit | Attending: Family Medicine | Admitting: Family Medicine

## 2011-04-11 DIAGNOSIS — M25561 Pain in right knee: Secondary | ICD-10-CM

## 2011-04-11 DIAGNOSIS — M2240 Chondromalacia patellae, unspecified knee: Secondary | ICD-10-CM | POA: Insufficient documentation

## 2011-04-11 DIAGNOSIS — M25569 Pain in unspecified knee: Secondary | ICD-10-CM | POA: Insufficient documentation

## 2011-04-13 NOTE — Progress Notes (Signed)
Addended by: Lenda Kelp on: 04/13/2011 02:34 PM   Modules accepted: Orders

## 2011-04-22 LAB — CBC
HCT: 43.2
Hemoglobin: 14.8
MCV: 90.3
Platelets: 197
RDW: 13

## 2011-04-22 LAB — DIFFERENTIAL
Basophils Absolute: 0
Basophils Relative: 0
Lymphocytes Relative: 29
Monocytes Absolute: 0.6
Neutro Abs: 6.7
Neutrophils Relative %: 63

## 2011-04-22 LAB — COMPREHENSIVE METABOLIC PANEL
Albumin: 4
BUN: 7
Chloride: 106
Creatinine, Ser: 0.64
Glucose, Bld: 76
Total Bilirubin: 0.5
Total Protein: 6.4

## 2011-04-22 LAB — POCT PREGNANCY, URINE: Operator id: 280881

## 2011-04-28 LAB — CBC
MCV: 92.4
Platelets: 157
RBC: 3.38 — ABNORMAL LOW
WBC: 5.8

## 2011-04-29 LAB — URINE CULTURE
Colony Count: NO GROWTH
Culture: NO GROWTH

## 2011-04-29 LAB — DIFFERENTIAL
Basophils Absolute: 0.6 — ABNORMAL HIGH
Basophils Relative: 3 — ABNORMAL HIGH
Eosinophils Absolute: 0.1
Eosinophils Relative: 0
Lymphocytes Relative: 24
Lymphs Abs: 4.8 — ABNORMAL HIGH
Monocytes Absolute: 1.2 — ABNORMAL HIGH
Monocytes Relative: 6
Neutro Abs: 13.7 — ABNORMAL HIGH
Neutrophils Relative %: 67

## 2011-04-29 LAB — URINALYSIS, ROUTINE W REFLEX MICROSCOPIC
Glucose, UA: NEGATIVE
Ketones, ur: 40 — AB
Leukocytes, UA: NEGATIVE
Nitrite: NEGATIVE
Protein, ur: 30 — AB
Specific Gravity, Urine: 1.036 — ABNORMAL HIGH
Urobilinogen, UA: 1
pH: 5.5

## 2011-04-29 LAB — CBC
HCT: 41.9
Hemoglobin: 14.6
MCHC: 34.8
MCV: 89.8
Platelets: 210
RBC: 4.67
RDW: 12.3
WBC: 20.4 — ABNORMAL HIGH

## 2011-04-29 LAB — PREGNANCY, URINE: Preg Test, Ur: NEGATIVE

## 2011-04-29 LAB — COMPREHENSIVE METABOLIC PANEL
AST: 28
CO2: 23
Calcium: 9.3
Creatinine, Ser: 0.9
GFR calc Af Amer: >60
GFR calc non Af Amer: >60
Glucose, Bld: 103 — ABNORMAL HIGH
Sodium: 137
Total Protein: 7.6

## 2011-04-29 LAB — GC/CHLAMYDIA PROBE AMP, GENITAL
Chlamydia, DNA Probe: NEGATIVE
GC Probe Amp, Genital: NEGATIVE

## 2011-04-29 LAB — URINE MICROSCOPIC-ADD ON

## 2011-04-29 LAB — COMPREHENSIVE METABOLIC PANEL WITH GFR
ALT: 22
Albumin: 4.5
Alkaline Phosphatase: 123 — ABNORMAL HIGH
BUN: 9
Chloride: 102
Potassium: 3.4 — ABNORMAL LOW
Total Bilirubin: 0.9

## 2011-04-29 LAB — WET PREP, GENITAL
Trich, Wet Prep: NONE SEEN
Yeast Wet Prep HPF POC: NONE SEEN

## 2011-04-29 LAB — LIPASE, BLOOD: Lipase: 28

## 2011-05-01 ENCOUNTER — Ambulatory Visit (HOSPITAL_BASED_OUTPATIENT_CLINIC_OR_DEPARTMENT_OTHER)
Admission: RE | Admit: 2011-05-01 | Discharge: 2011-05-01 | Disposition: A | Discharge status: Home / Self Care | Payer: Medicaid Other | Source: Ambulatory Visit | Attending: Orthopedic Surgery | Admitting: Orthopedic Surgery

## 2011-05-01 DIAGNOSIS — Z01812 Encounter for preprocedural laboratory examination: Secondary | ICD-10-CM | POA: Insufficient documentation

## 2011-05-01 DIAGNOSIS — M238X9 Other internal derangements of unspecified knee: Secondary | ICD-10-CM | POA: Insufficient documentation

## 2011-05-01 LAB — POCT HEMOGLOBIN-HEMACUE: Hemoglobin: 14.1 g/dL (ref 12.0–15.0)

## 2011-05-04 NOTE — Op Note (Signed)
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  OPERATIVE PROCEDURE:  Right knee arthroscopy with chondroplasty and open medial patellofemoral ligament imbrication.  ANESTHESIA:  General with a femoral nerve block.  ESTIMATED BLOOD LOSS:  Minimal.  TOURNIQUET TIME:  Approximately 55 minutes.  PREOPERATIVE INDICATIONS:  Mrs. Madison Matthews is a 30 year old woman who complained of recurrent right knee instability with patellar dislocation.  She also has moderate-to-severe pain with this.  She elected for surgical management.  She failed conservative treatments. The risks, benefits, and alternatives were discussed before the procedure including not limited to risks of recurrent instability, ongoing pain, stiffness, loss of function, patellar arthritis, cardiopulmonary complications, among others and she is willing to proceed.  OPERATIVE FINDINGS:  She had extreme hypermobility of the patella, and I could subluxate her almost completely over the lateral ridge during the exam under anesthesia as well as during the exam by direct visualization with the arthroscopy equipment.  The medial and lateral compartments and ACL and PCL and MCL and LCL were all normal.  She had a negative dial test as well.  There were no significant chondral lesions on either of the femoral condyles or in the trochlea.  The patella itself did have a small area of chondromalacia at the  apex of the patella which was debrided.  OPERATIVE PROCEDURE:  The patient was brought to the operating room and placed in supine position.  IV antibiotics were given.  General anesthesia was administered.  The right lower extremity was prepped and draped in the usual sterile fashion.  The leg was elevated, exsanguinated, and the tourniquet was inflated.  Time-out was performed. Diagnostic arthroscopy was carried out.  I used the shaver to debride the undersurface of the patella.  There was also a substantial thinning and weakening of the superomedial capsule and medial patellofemoral ligament, which were unable to be seen arthroscopically.  After debridement of the patellar chondral lesion, I removed the arthroscopic instruments and performed an open imbrication.  Incision was made superomedially, and dissection was carried down to the superficial fascial layer.  This was separated, and preserved for closure later.  Once I got to the deep layer which contained both the medial patellofemoral ligament as well as the joint capsule, I made an incision and an arthrotomy and then used a #2 FiberWire x3 for imbrication.  I tucked the medial limb of tissue on top of the lateral limb of tissue and imbricated about 1 cm of redundant tissue.  Excellent restoration of stability to the patella was achieved.  The patella was now centered.  I tied all of the sutures, and  then inserted the arthroscope one last time and confirmed that I was happy with the reduction of patellar tension. I then repaired the superficial fascial layer with a similar type stitch using two 0-Vicryl sutures.  I then irrigated the wounds copiously and repaired the subcutaneous tissue with Vicryl and Monocryl for the skin and portal sites.  Tourniquet was released and sterile gauze and a knee immobilizer was applied.  She was awakened and returned to PACU in stable and satisfactory condition.  Janace Litten, orthopedic PA-C  was present and scrubbed throughout the case and assisted with positioning as well as tensioning and closure.  There were no complications and she tolerated the procedure well.     Eulas Post, MD     JPL/MEDQ  D:  05/01/2011  T:  05/01/2011  Job:  [id number]  Electronically Signed by Teryl Lucy MD on 05/04/2011 07:23:10 PM

## 2011-05-05 LAB — I-STAT 8, (EC8 V) (CONVERTED LAB)
Acid-base deficit: 4 — ABNORMAL HIGH
BUN: 7
Bicarbonate: 22.5
Chloride: 114 — ABNORMAL HIGH
Glucose, Bld: 105 — ABNORMAL HIGH
Glucose, Bld: 107 — ABNORMAL HIGH
Hemoglobin: 15
Potassium: 6.3
Sodium: 141
TCO2: 24
pH, Ven: 7.233 — ABNORMAL LOW
pH, Ven: 7.305 — ABNORMAL HIGH

## 2011-05-05 LAB — RAPID URINE DRUG SCREEN, HOSP PERFORMED
Amphetamines: NOT DETECTED
Barbiturates: NOT DETECTED

## 2011-05-05 LAB — CK: Total CK: 135

## 2011-05-05 LAB — SAMPLE TO BLOOD BANK

## 2011-05-05 LAB — URINALYSIS, ROUTINE W REFLEX MICROSCOPIC
Hgb urine dipstick: NEGATIVE
Specific Gravity, Urine: 1.008
Urobilinogen, UA: 0.2
pH: 5.5

## 2011-05-05 LAB — ETHANOL: Alcohol, Ethyl (B): 253 — ABNORMAL HIGH

## 2011-05-05 LAB — POTASSIUM: Potassium: 5.9 — ABNORMAL HIGH

## 2011-05-05 LAB — POCT PREGNANCY, URINE: Operator id: 196461

## 2011-05-05 LAB — URINE MICROSCOPIC-ADD ON

## 2011-05-08 LAB — URINE MICROSCOPIC-ADD ON

## 2011-05-08 LAB — URINALYSIS, ROUTINE W REFLEX MICROSCOPIC
Ketones, ur: 15 — AB
Protein, ur: NEGATIVE
Urobilinogen, UA: 0.2

## 2011-05-08 LAB — URINE CULTURE: Colony Count: NO GROWTH

## 2011-05-08 LAB — CBC
Hemoglobin: 13.6
MCHC: 35.4
MCV: 92
RBC: 4.19

## 2011-05-11 LAB — WET PREP, GENITAL: Yeast Wet Prep HPF POC: NONE SEEN

## 2011-05-11 LAB — URINALYSIS, ROUTINE W REFLEX MICROSCOPIC
Glucose, UA: NEGATIVE
Hgb urine dipstick: NEGATIVE
Protein, ur: NEGATIVE
pH: 5.5

## 2011-05-11 LAB — CBC
HCT: 42.3
Hemoglobin: 14.3
RBC: 4.54
RDW: 12.8

## 2011-05-11 LAB — ABO/RH: ABO/RH(D): O POS

## 2011-05-11 LAB — HCG, QUANTITATIVE, PREGNANCY: hCG, Beta Chain, Quant, S: 14154 — ABNORMAL HIGH

## 2011-06-01 ENCOUNTER — Emergency Department (HOSPITAL_BASED_OUTPATIENT_CLINIC_OR_DEPARTMENT_OTHER)
Admission: EM | Admit: 2011-06-01 | Discharge: 2011-06-01 | Disposition: A | Discharge status: Home / Self Care | Payer: Medicaid Other | Attending: Emergency Medicine | Admitting: Emergency Medicine

## 2011-06-01 ENCOUNTER — Encounter (HOSPITAL_BASED_OUTPATIENT_CLINIC_OR_DEPARTMENT_OTHER): Payer: Self-pay

## 2011-06-01 DIAGNOSIS — N898 Other specified noninflammatory disorders of vagina: Secondary | ICD-10-CM | POA: Insufficient documentation

## 2011-06-01 DIAGNOSIS — A64 Unspecified sexually transmitted disease: Secondary | ICD-10-CM | POA: Insufficient documentation

## 2011-06-01 DIAGNOSIS — A599 Trichomoniasis, unspecified: Secondary | ICD-10-CM | POA: Insufficient documentation

## 2011-06-01 DIAGNOSIS — N730 Acute parametritis and pelvic cellulitis: Secondary | ICD-10-CM | POA: Insufficient documentation

## 2011-06-01 HISTORY — DX: Malignant neoplasm of cervix uteri, unspecified: C53.9

## 2011-06-01 LAB — WET PREP, GENITAL: Yeast Wet Prep HPF POC: NONE SEEN

## 2011-06-01 LAB — PREGNANCY, URINE: Preg Test, Ur: NEGATIVE

## 2011-06-01 MED ORDER — AZITHROMYCIN 250 MG PO TABS
1000.0000 mg | ORAL_TABLET | Freq: Once | ORAL | Status: AC
  Administered 2011-06-01: 1000 mg via ORAL
  Filled 2011-06-01: qty 4

## 2011-06-01 MED ORDER — CEFTRIAXONE SODIUM 250 MG IJ SOLR
250.0000 mg | Freq: Once | INTRAMUSCULAR | Status: AC
  Administered 2011-06-01: 250 mg via INTRAMUSCULAR
  Filled 2011-06-01: qty 250

## 2011-06-01 MED ORDER — IBUPROFEN 600 MG PO TABS: 600.0000 mg | ORAL_TABLET | Freq: Four times a day (QID) | ORAL | Status: AC | PRN

## 2011-06-01 MED ORDER — LIDOCAINE HCL (PF) 1 % IJ SOLN
INTRAMUSCULAR | Status: AC
  Administered 2011-06-01: 17:00:00
  Filled 2011-06-01: qty 5

## 2011-06-01 MED ORDER — HYDROCODONE-ACETAMINOPHEN 5-325 MG PO TABS: 2.0000 | ORAL_TABLET | ORAL | Status: AC | PRN

## 2011-06-01 MED ORDER — ONDANSETRON 8 MG PO TBDP
8.0000 mg | ORAL_TABLET | Freq: Once | ORAL | Status: AC
  Administered 2011-06-01: 8 mg via ORAL
  Filled 2011-06-01: qty 1

## 2011-06-01 MED ORDER — METRONIDAZOLE 500 MG PO TABS: 500.0000 mg | ORAL_TABLET | Freq: Two times a day (BID) | ORAL | Status: AC

## 2011-06-01 MED ORDER — DOXYCYCLINE HYCLATE 100 MG PO CAPS: 100.0000 mg | ORAL_CAPSULE | Freq: Two times a day (BID) | ORAL | Status: AC

## 2011-06-01 NOTE — ED Notes (Signed)
Vaginal d/c, dysuria x 2-3 weeks-GYN called in "one pill a week ago"-s/s no better

## 2011-06-02 LAB — GC/CHLAMYDIA PROBE AMP, GENITAL: GC Probe Amp, Genital: NEGATIVE

## 2011-06-02 NOTE — ED Provider Notes (Signed)
Medical screening examination/treatment/procedure(s) were performed by non-physician practitioner and as supervising physician I was immediately available for consultation/collaboration.   Addaleigh Nicholls, MD 06/02/11 1656 

## 2011-06-02 NOTE — ED Provider Notes (Signed)
History     CSN: [id number] Arrival date & time: 06/01/2011  3:24 PM   First MD Initiated Contact with Patient 06/01/11 1526      Chief Complaint  Patient presents with  . Dysuria  . Vaginal Discharge    (Consider location/radiation/quality/duration/timing/severity/associated sxs/prior treatment) Patient is a 30 y.o. female presenting with dysuria and vaginal discharge. The history is provided by the patient. No language interpreter was used.  Dysuria  This is a new problem. The current episode started more than 2 days ago. The problem occurs intermittently. The problem has been gradually worsening. The quality of the pain is described as aching. The pain is at a severity of 4/10. The pain is moderate. There has been no fever. Associated symptoms include frequency, hematuria, hesitancy, urgency and flank pain. Pertinent negatives include no chills, no vomiting and no discharge. She has tried nothing for the symptoms.  Vaginal Discharge Pertinent negatives include no chills or vomiting.    Past Medical History  Diagnosis Date  . Cervical cancer     Past Surgical History  Procedure Date  . Knee surgery   . Cervix surgery     Family History  Problem Relation Age of Onset  . Hypertension Mother   . Hypertension Father   . Heart attack Paternal Grandmother   . Diabetes Paternal Grandfather     History  Substance Use Topics  . Smoking status: Current Everyday Smoker -- 1.0 packs/day    Types: Cigarettes  . Smokeless tobacco: Not on file  . Alcohol Use: No    OB History    Grav Para Term Preterm Abortions TAB SAB Ect Mult Living                  Review of Systems  Constitutional: Negative for chills.  Gastrointestinal: Negative for vomiting.  Genitourinary: Positive for dysuria, hesitancy, urgency, frequency, hematuria, flank pain and vaginal discharge.  All other systems reviewed and are negative.    Allergies  Review of patient's allergies indicates no known  allergies.  Home Medications   Current Outpatient Rx  Name Route Sig Dispense Refill  . LISDEXAMFETAMINE DIMESYLATE 30 MG PO CAPS Oral Take 30 mg by mouth every morning.      Marland Kitchen METHOCARBAMOL 750 MG PO TABS Oral Take 750 mg by mouth every 6 (six) hours as needed. For pain     . PROMETHAZINE HCL 25 MG PO TABS Oral Take 25 mg by mouth every 6 (six) hours as needed. For nausea     . TEMAZEPAM 15 MG PO CAPS Oral Take 15-30 mg by mouth at bedtime.      Marland Kitchen DOXYCYCLINE HYCLATE 100 MG PO CAPS Oral Take 1 capsule (100 mg total) by mouth 2 (two) times daily. 20 capsule 0  . HYDROCODONE-ACETAMINOPHEN 5-325 MG PO TABS Oral Take 2 tablets by mouth every 4 (four) hours as needed for pain. 6 tablet 0  . IBUPROFEN 600 MG PO TABS Oral Take 1 tablet (600 mg total) by mouth every 6 (six) hours as needed for pain. 30 tablet 0  . MELOXICAM 15 MG PO TABS Oral Take 1 tablet (15 mg total) by mouth daily. 30 tablet 2  . METRONIDAZOLE 500 MG PO TABS Oral Take 1 tablet (500 mg total) by mouth 2 (two) times daily. 14 tablet 0    BP 146/90  Pulse 95  Temp(Src) 98.6 F (37 C) (Oral)  Resp 16  Ht 5\' 9"  (1.753 m)  Wt 200 lb (90.719 kg)  BMI 29.53 kg/m2  SpO2 100%  LMP 05/06/2011  Physical Exam  Constitutional: She appears well-developed and well-nourished.  Cardiovascular: Normal rate.   Abdominal: Soft. There is tenderness in the suprapubic area. There is CVA tenderness.    ED Course  Procedures (including critical care time)  Labs Reviewed  WET PREP, GENITAL - Abnormal; Notable for the following:    Trich, Wet Prep MANY (*)    Clue Cells, Wet Prep MODERATE (*)    WBC, Wet Prep HPF POC MODERATE (*)    All other components within normal limits  URINE MICROSCOPIC-ADD ON - Abnormal; Notable for the following:    Squamous Epithelial / LPF FEW (*)    Bacteria, UA FEW (*)    All other components within normal limits  GC/CHLAMYDIA PROBE AMP, GENITAL  PREGNANCY, URINE   No results found.   1. PID (acute  pelvic inflammatory disease)   2. Sexually transmitted disease   3. Trichomoniasis       MDM          Jethro Bastos, NP 06/02/11 1213

## 2011-06-16 ENCOUNTER — Ambulatory Visit: Payer: Medicaid Other | Attending: Orthopedic Surgery | Admitting: Physical Therapy

## 2011-06-16 DIAGNOSIS — IMO0001 Reserved for inherently not codable concepts without codable children: Secondary | ICD-10-CM | POA: Insufficient documentation

## 2011-06-16 DIAGNOSIS — M25569 Pain in unspecified knee: Secondary | ICD-10-CM | POA: Insufficient documentation

## 2011-06-16 DIAGNOSIS — M25669 Stiffness of unspecified knee, not elsewhere classified: Secondary | ICD-10-CM | POA: Insufficient documentation

## 2011-06-30 ENCOUNTER — Ambulatory Visit: Payer: Medicaid Other | Admitting: Physical Therapy

## 2011-07-02 ENCOUNTER — Ambulatory Visit: Payer: Medicaid Other | Attending: Orthopedic Surgery | Admitting: Physical Therapy

## 2011-07-02 DIAGNOSIS — IMO0001 Reserved for inherently not codable concepts without codable children: Secondary | ICD-10-CM | POA: Insufficient documentation

## 2011-07-02 DIAGNOSIS — M25569 Pain in unspecified knee: Secondary | ICD-10-CM | POA: Insufficient documentation

## 2011-07-02 DIAGNOSIS — M25669 Stiffness of unspecified knee, not elsewhere classified: Secondary | ICD-10-CM | POA: Insufficient documentation

## 2012-11-17 ENCOUNTER — Encounter (HOSPITAL_BASED_OUTPATIENT_CLINIC_OR_DEPARTMENT_OTHER): Payer: Self-pay | Admitting: *Deleted

## 2012-11-17 ENCOUNTER — Emergency Department (HOSPITAL_BASED_OUTPATIENT_CLINIC_OR_DEPARTMENT_OTHER)
Admission: EM | Admit: 2012-11-17 | Discharge: 2012-11-17 | Disposition: A | Discharge status: Home / Self Care | Payer: Self-pay | Attending: Emergency Medicine | Admitting: Emergency Medicine

## 2012-11-17 ENCOUNTER — Emergency Department (HOSPITAL_BASED_OUTPATIENT_CLINIC_OR_DEPARTMENT_OTHER): Payer: Self-pay

## 2012-11-17 DIAGNOSIS — Z3202 Encounter for pregnancy test, result negative: Secondary | ICD-10-CM | POA: Insufficient documentation

## 2012-11-17 DIAGNOSIS — Z8541 Personal history of malignant neoplasm of cervix uteri: Secondary | ICD-10-CM | POA: Insufficient documentation

## 2012-11-17 DIAGNOSIS — N898 Other specified noninflammatory disorders of vagina: Secondary | ICD-10-CM | POA: Insufficient documentation

## 2012-11-17 DIAGNOSIS — F17200 Nicotine dependence, unspecified, uncomplicated: Secondary | ICD-10-CM | POA: Insufficient documentation

## 2012-11-17 DIAGNOSIS — Z8619 Personal history of other infectious and parasitic diseases: Secondary | ICD-10-CM | POA: Insufficient documentation

## 2012-11-17 DIAGNOSIS — N739 Female pelvic inflammatory disease, unspecified: Secondary | ICD-10-CM | POA: Insufficient documentation

## 2012-11-17 DIAGNOSIS — N949 Unspecified condition associated with female genital organs and menstrual cycle: Secondary | ICD-10-CM | POA: Insufficient documentation

## 2012-11-17 LAB — URINALYSIS, ROUTINE W REFLEX MICROSCOPIC
Glucose, UA: NEGATIVE mg/dL
Ketones, ur: NEGATIVE mg/dL
Leukocytes, UA: NEGATIVE
Nitrite: NEGATIVE
Specific Gravity, Urine: 1.024 (ref 1.005–1.030)
pH: 5.5 (ref 5.0–8.0)

## 2012-11-17 LAB — WET PREP, GENITAL: Clue Cells Wet Prep HPF POC: NONE SEEN

## 2012-11-17 MED ORDER — MORPHINE SULFATE 4 MG/ML IJ SOLN
4.0000 mg | Freq: Once | INTRAMUSCULAR | Status: AC
  Administered 2012-11-17: 4 mg via INTRAVENOUS
  Filled 2012-11-17: qty 1

## 2012-11-17 MED ORDER — ONDANSETRON HCL 4 MG/2ML IJ SOLN
4.0000 mg | Freq: Once | INTRAMUSCULAR | Status: AC
  Administered 2012-11-17: 4 mg via INTRAVENOUS
  Filled 2012-11-17: qty 2

## 2012-11-17 MED ORDER — CEFTRIAXONE SODIUM 250 MG IJ SOLR
250.0000 mg | Freq: Once | INTRAMUSCULAR | Status: AC
  Administered 2012-11-17: 250 mg via INTRAMUSCULAR
  Filled 2012-11-17: qty 250

## 2012-11-17 MED ORDER — DOXYCYCLINE HYCLATE 100 MG PO CAPS: 100.0000 mg | ORAL_CAPSULE | Freq: Two times a day (BID) | ORAL | Status: DC

## 2012-11-17 MED ORDER — OXYCODONE-ACETAMINOPHEN 5-325 MG PO TABS: 2.0000 | ORAL_TABLET | ORAL | Status: DC | PRN

## 2012-11-17 NOTE — ED Notes (Signed)
No signs/sx of reaction noted.  Pt d/c'd home.

## 2012-11-17 NOTE — ED Notes (Signed)
C/o suprapubic pain radiating to right lower back since 1000 yesterday.  No n/v/d.  No fever.  Had mild dysuria about one month ago. No vaginal discharge or bleeding.  Sitting down makes pain worse.  Lying down somewhat relieves pain. Took IBU and Tylenol yesterday without relief.

## 2012-11-17 NOTE — ED Provider Notes (Signed)
History     CSN: [id number]  Arrival date & time 11/17/12  1219   First MD Initiated Contact with Patient 11/17/12 1404      Chief Complaint  Patient presents with  . Abdominal Pain    (Consider location/radiation/quality/duration/timing/severity/associated sxs/prior treatment) Patient is a 32 y.o. female presenting with abdominal pain.  Abdominal Pain  Pt reports sudden onset yesterday of sharp, severe bilateral but L>R pelvic pain, occasionally radiates into the L flank. Not associated with fever, vomiting diarrhea or dysuria. She was treated for trichomonas and PID about 2 years ago but denies any vaginal bleeding or discharge today.   Past Medical History  Diagnosis Date  . Cervical cancer     Past Surgical History  Procedure Laterality Date  . Knee surgery    . Cervix surgery    . Appendectomy      Family History  Problem Relation Age of Onset  . Hypertension Mother   . Hypertension Father   . Heart attack Paternal Grandmother   . Diabetes Paternal Grandfather     History  Substance Use Topics  . Smoking status: Current Every Day Smoker -- 1.00 packs/day    Types: Cigarettes  . Smokeless tobacco: Not on file  . Alcohol Use: No    OB History   Grav Para Term Preterm Abortions TAB SAB Ect Mult Living                  Review of Systems  Gastrointestinal: Positive for abdominal pain.   All other systems reviewed and are negative except as noted in HPI.   Allergies  Review of patient's allergies indicates no known allergies.  Home Medications  No current outpatient prescriptions on file.  BP 138/81  Pulse 81  Temp(Src) 97.9 F (36.6 C) (Oral)  Resp 12  Ht 5\' 8"  (1.727 m)  Wt 215 lb (97.523 kg)  BMI 32.7 kg/m2  SpO2 97%  LMP 10/30/2012  Physical Exam  Nursing note and vitals reviewed. Constitutional: She is oriented to person, place, and time. She appears well-developed and well-nourished.  HENT:  Head: Normocephalic and atraumatic.   Eyes: EOM are normal. Pupils are equal, round, and reactive to light.  Neck: Normal range of motion. Neck supple.  Cardiovascular: Normal rate, normal heart sounds and intact distal pulses.   Pulmonary/Chest: Effort normal and breath sounds normal.  Abdominal: Bowel sounds are normal. She exhibits no distension. There is no tenderness.  Genitourinary: Cervix exhibits motion tenderness and discharge. Cervix exhibits no friability. Right adnexum displays tenderness. Right adnexum displays no mass. Left adnexum displays tenderness. Left adnexum displays no mass. No bleeding around the vagina. Vaginal discharge found.  Musculoskeletal: Normal range of motion. She exhibits no edema and no tenderness.  Neurological: She is alert and oriented to person, place, and time. She has normal strength. No cranial nerve deficit or sensory deficit.  Skin: Skin is warm and dry. No rash noted.  Psychiatric: She has a normal mood and affect.    ED Course  Procedures (including critical care time)  Labs Reviewed  WET PREP, GENITAL - Abnormal; Notable for the following:    WBC, Wet Prep HPF POC FEW (*)    All other components within normal limits  GC/CHLAMYDIA PROBE AMP  URINALYSIS, ROUTINE W REFLEX MICROSCOPIC  PREGNANCY, URINE   US Transvaginal Non-ob  11/17/2012  *RADIOLOGY REPORT*  Clinical Data:  Pelvic pain.  TRANSABDOMINAL AND TRANSVAGINAL ULTRASOUND OF PELVIS DOPPLER ULTRASOUND OF OVARIES  Technique:  Both  transabdominal and transvaginal ultrasound examinations of the pelvis were performed. Transabdominal technique was performed for global imaging of the pelvis including uterus, ovaries, adnexal regions, and pelvic cul-de-sac.  It was necessary to proceed with endovaginal exam following the transabdominal exam to visualize the ovaries and endometrium.  Color and duplex Doppler ultrasound was utilized to evaluate blood flow to the ovaries.  Comparison:  None.  Findings:  Uterus:  Measures 8.3 x 5.3 x 5.0  cm.  No myometrial abnormalities. Nabothian cysts are noted at the cervix.  Endometrium:  Normal in thickness measuring a maximum of 19 mm.  Right ovary: Measures 3.5 x 2.0 x 2.8 cm  Left ovary:   Measures 4.4 x 2.5 x 2.9 cm.  A 1.9 cm cyst is noted.  Pulsed Doppler evaluation demonstrates normal low-resistance arterial and venous waveforms in both ovaries.  IMPRESSION: Normal sonographic appearance of the uterus and ovaries.  No sonographic evidence for ovarian torsion.   Original Report Authenticated By: Rudie Meyer, M.D.    US Pelvis Complete  11/17/2012  *RADIOLOGY REPORT*  Clinical Data:  Pelvic pain.  TRANSABDOMINAL AND TRANSVAGINAL ULTRASOUND OF PELVIS DOPPLER ULTRASOUND OF OVARIES  Technique:  Both transabdominal and transvaginal ultrasound examinations of the pelvis were performed. Transabdominal technique was performed for global imaging of the pelvis including uterus, ovaries, adnexal regions, and pelvic cul-de-sac.  It was necessary to proceed with endovaginal exam following the transabdominal exam to visualize the ovaries and endometrium.  Color and duplex Doppler ultrasound was utilized to evaluate blood flow to the ovaries.  Comparison:  None.  Findings:  Uterus:  Measures 8.3 x 5.3 x 5.0 cm.  No myometrial abnormalities. Nabothian cysts are noted at the cervix.  Endometrium:  Normal in thickness measuring a maximum of 19 mm.  Right ovary: Measures 3.5 x 2.0 x 2.8 cm  Left ovary:   Measures 4.4 x 2.5 x 2.9 cm.  A 1.9 cm cyst is noted.  Pulsed Doppler evaluation demonstrates normal low-resistance arterial and venous waveforms in both ovaries.  IMPRESSION: Normal sonographic appearance of the uterus and ovaries.  No sonographic evidence for ovarian torsion.   Original Report Authenticated By: Rudie Meyer, M.D.    [place] Art/ven Flow Abd Pelv Doppler  11/17/2012  *RADIOLOGY REPORT*  Clinical Data:  Pelvic pain.  TRANSABDOMINAL AND TRANSVAGINAL ULTRASOUND OF PELVIS DOPPLER ULTRASOUND OF OVARIES   Technique:  Both transabdominal and transvaginal ultrasound examinations of the pelvis were performed. Transabdominal technique was performed for global imaging of the pelvis including uterus, ovaries, adnexal regions, and pelvic cul-de-sac.  It was necessary to proceed with endovaginal exam following the transabdominal exam to visualize the ovaries and endometrium.  Color and duplex Doppler ultrasound was utilized to evaluate blood flow to the ovaries.  Comparison:  None.  Findings:  Uterus:  Measures 8.3 x 5.3 x 5.0 cm.  No myometrial abnormalities. Nabothian cysts are noted at the cervix.  Endometrium:  Normal in thickness measuring a maximum of 19 mm.  Right ovary: Measures 3.5 x 2.0 x 2.8 cm  Left ovary:   Measures 4.4 x 2.5 x 2.9 cm.  A 1.9 cm cyst is noted.  Pulsed Doppler evaluation demonstrates normal low-resistance arterial and venous waveforms in both ovaries.  IMPRESSION: Normal sonographic appearance of the uterus and ovaries.  No sonographic evidence for ovarian torsion.   Original Report Authenticated By: Rudie Meyer, M.D.      1. PID (pelvic inflammatory disease)       MDM  Wet Prep neg,  [place] neg for cyst or torsion. Will treat for PID. Advised Gyn followup.         Charles B. Bernette Mayers, MD 11/17/12 [phone]

## 2012-11-17 NOTE — ED Notes (Signed)
Pt. Reports sharp abd. Pain in the low abd. Pain that "shoots" to the L side of low abd. And into L low back.  Pt. Reports she is urinating ok and no trouble deficating.  No vomiting or diarrhea.

## 2012-11-18 LAB — GC/CHLAMYDIA PROBE AMP: CT Probe RNA: NEGATIVE

## 2014-08-19 ENCOUNTER — Inpatient Hospital Stay (HOSPITAL_COMMUNITY): Payer: Medicaid Other

## 2014-08-19 ENCOUNTER — Inpatient Hospital Stay (HOSPITAL_COMMUNITY)
Admission: AD | Admit: 2014-08-19 | Discharge: 2014-08-19 | Disposition: A | Discharge status: Home / Self Care | Payer: Medicaid Other | Source: Ambulatory Visit | Attending: Obstetrics and Gynecology | Admitting: Obstetrics and Gynecology

## 2014-08-19 ENCOUNTER — Encounter (HOSPITAL_COMMUNITY): Payer: Self-pay | Admitting: *Deleted

## 2014-08-19 DIAGNOSIS — O99331 Smoking (tobacco) complicating pregnancy, first trimester: Secondary | ICD-10-CM | POA: Insufficient documentation

## 2014-08-19 DIAGNOSIS — R109 Unspecified abdominal pain: Secondary | ICD-10-CM

## 2014-08-19 DIAGNOSIS — O9989 Other specified diseases and conditions complicating pregnancy, childbirth and the puerperium: Secondary | ICD-10-CM | POA: Insufficient documentation

## 2014-08-19 DIAGNOSIS — Z3A01 Less than 8 weeks gestation of pregnancy: Secondary | ICD-10-CM | POA: Insufficient documentation

## 2014-08-19 DIAGNOSIS — R1032 Left lower quadrant pain: Secondary | ICD-10-CM | POA: Insufficient documentation

## 2014-08-19 DIAGNOSIS — O26899 Other specified pregnancy related conditions, unspecified trimester: Secondary | ICD-10-CM

## 2014-08-19 DIAGNOSIS — F17210 Nicotine dependence, cigarettes, uncomplicated: Secondary | ICD-10-CM | POA: Insufficient documentation

## 2014-08-19 LAB — COMPREHENSIVE METABOLIC PANEL
ALBUMIN: 4.3 g/dL (ref 3.5–5.2)
ALT: 32 U/L (ref 0–35)
AST: 27 U/L (ref 0–37)
Alkaline Phosphatase: 70 U/L (ref 39–117)
Anion gap: 8 (ref 5–15)
BUN: 7 mg/dL (ref 6–23)
CHLORIDE: 107 mmol/L (ref 96–112)
CO2: 22 mmol/L (ref 19–32)
CREATININE: 0.72 mg/dL (ref 0.50–1.10)
Calcium: 9.3 mg/dL (ref 8.4–10.5)
GFR calc non Af Amer: >90 mL/min (ref >90)
GLUCOSE: 96 mg/dL (ref 70–99)
POTASSIUM: 3.9 mmol/L (ref 3.5–5.1)
SODIUM: 137 mmol/L (ref 135–145)
TOTAL PROTEIN: 7 g/dL (ref 6.0–8.3)
Total Bilirubin: 0.5 mg/dL (ref 0.3–1.2)

## 2014-08-19 LAB — CBC WITH DIFFERENTIAL/PLATELET
BASOS ABS: 0 10*3/uL (ref 0.0–0.1)
BASOS PCT: 0 % (ref 0–1)
Eosinophils Absolute: 0.2 10*3/uL (ref 0.0–0.7)
Eosinophils Relative: 1 % (ref 0–5)
HEMATOCRIT: 42.3 % (ref 36.0–46.0)
Hemoglobin: 14.7 g/dL (ref 12.0–15.0)
Lymphocytes Relative: 28 % (ref 12–46)
Lymphs Abs: 3.6 10*3/uL (ref 0.7–4.0)
MCH: 31.4 pg (ref 26.0–34.0)
MCHC: 34.8 g/dL (ref 30.0–36.0)
MCV: 90.4 fL (ref 78.0–100.0)
MONO ABS: 0.6 10*3/uL (ref 0.1–1.0)
Monocytes Relative: 5 % (ref 3–12)
NEUTROS ABS: 8.7 10*3/uL — AB (ref 1.7–7.7)
NEUTROS PCT: 66 % (ref 43–77)
PLATELETS: 212 10*3/uL (ref 150–400)
RBC: 4.68 MIL/uL (ref 3.87–5.11)
RDW: 12.8 % (ref 11.5–15.5)
WBC: 13.1 10*3/uL — AB (ref 4.0–10.5)

## 2014-08-19 LAB — HCG, QUANTITATIVE, PREGNANCY: hCG, Beta Chain, Quant, S: 3053 m[IU]/mL — ABNORMAL HIGH (ref <5)

## 2014-08-19 MED ORDER — TRAMADOL HCL 50 MG PO TABS: 50.0000 mg | ORAL_TABLET | Freq: Four times a day (QID) | ORAL | Status: DC | PRN
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
## 2014-08-19 NOTE — Discharge Instructions (Signed)
Ultrasound today shows that the pregnancy is in your uterus. It is still too early to see the baby or determine an exact gestational age and due date, but you are approximately 5-1/[redacted] weeks gestation.  Abdominal Pain During Pregnancy Abdominal pain is common in pregnancy. Most of the time, it does not cause harm. There are many causes of abdominal pain. Some causes are more serious than others. Some of the causes of abdominal pain in pregnancy are easily diagnosed. Occasionally, the diagnosis takes time to understand. Other times, the cause is not determined. Abdominal pain can be a sign that something is very wrong with the pregnancy, or the pain may have nothing to do with the pregnancy at all. For this reason, always tell your health care provider if you have any abdominal discomfort. HOME CARE INSTRUCTIONS  Monitor your abdominal pain for any changes. The following actions may help to alleviate any discomfort you are experiencing:  Do not have sexual intercourse or put anything in your vagina until your symptoms go away completely.  Get plenty of rest until your pain improves.  Drink clear fluids if you feel nauseous. Avoid solid food as long as you are uncomfortable or nauseous.  Only take over-the-counter or prescription medicine as directed by your health care provider.  Keep all follow-up appointments with your health care provider. SEEK IMMEDIATE MEDICAL CARE IF:  You are bleeding, leaking fluid, or passing tissue from the vagina.  You have increasing pain or cramping.  You have persistent vomiting.  You have painful or bloody urination.  You have a fever.  You notice a decrease in your baby's movements.  You have extreme weakness or feel faint.  You have shortness of breath, with or without abdominal pain.  You develop a severe headache with abdominal pain.  You have abnormal vaginal discharge with abdominal pain.  You have persistent diarrhea.  You have abdominal  pain that continues even after rest, or gets worse. MAKE SURE YOU:   Understand these instructions.  Will watch your condition.  Will get help right away if you are not doing well or get worse. Document Released: 07/13/2005 Document Revised: 05/03/2013 Document Reviewed: 02/09/2013 [facility] Patient Information 2015 [place], [place]. This information is not intended to replace advice given to you by your health care provider. Make sure you discuss any questions you have with your health care provider.

## 2014-09-27 ENCOUNTER — Emergency Department (HOSPITAL_BASED_OUTPATIENT_CLINIC_OR_DEPARTMENT_OTHER)
Admission: EM | Admit: 2014-09-27 | Discharge: 2014-09-27 | Disposition: A | Discharge status: Home / Self Care | Payer: Medicaid Other | Attending: Emergency Medicine | Admitting: Emergency Medicine

## 2014-09-27 ENCOUNTER — Encounter (HOSPITAL_BASED_OUTPATIENT_CLINIC_OR_DEPARTMENT_OTHER): Payer: Self-pay | Admitting: Emergency Medicine

## 2014-09-27 DIAGNOSIS — Z3202 Encounter for pregnancy test, result negative: Secondary | ICD-10-CM | POA: Insufficient documentation

## 2014-09-27 DIAGNOSIS — N760 Acute vaginitis: Secondary | ICD-10-CM | POA: Insufficient documentation

## 2014-09-27 DIAGNOSIS — Z8541 Personal history of malignant neoplasm of cervix uteri: Secondary | ICD-10-CM | POA: Insufficient documentation

## 2014-09-27 DIAGNOSIS — Z720 Tobacco use: Secondary | ICD-10-CM | POA: Insufficient documentation

## 2014-09-27 LAB — PREGNANCY, URINE: PREG TEST UR: NEGATIVE

## 2014-09-27 LAB — URINALYSIS, ROUTINE W REFLEX MICROSCOPIC
Bilirubin Urine: NEGATIVE
Glucose, UA: NEGATIVE mg/dL
HGB URINE DIPSTICK: NEGATIVE
Ketones, ur: NEGATIVE mg/dL
NITRITE: NEGATIVE
PROTEIN: NEGATIVE mg/dL
SPECIFIC GRAVITY, URINE: 1.028 (ref 1.005–1.030)
UROBILINOGEN UA: 0.2 mg/dL (ref 0.0–1.0)
pH: 5 (ref 5.0–8.0)

## 2014-09-27 LAB — URINE MICROSCOPIC-ADD ON

## 2014-09-27 MED ORDER — HYDROCODONE-ACETAMINOPHEN 5-325 MG PO TABS
2.0000 | ORAL_TABLET | Freq: Once | ORAL | Status: AC
  Administered 2014-09-27: 2 via ORAL
  Filled 2014-09-27: qty 2

## 2014-09-27 MED ORDER — LIDOCAINE HCL 2 % EX GEL
1.0000 "application " | Freq: Once | CUTANEOUS | Status: AC
  Administered 2014-09-27: 1 via TOPICAL
  Filled 2014-09-27: qty 20

## 2014-09-27 MED ORDER — VALACYCLOVIR HCL 1 G PO TABS
1000.0000 mg | ORAL_TABLET | Freq: Two times a day (BID) | ORAL | Status: AC
Start: 2014-09-27 — End: ?

## 2014-09-27 MED ORDER — LIDOCAINE 5 % EX OINT: 1.0000 "application " | TOPICAL_OINTMENT | Freq: Three times a day (TID) | CUTANEOUS | Status: DC | PRN

## 2014-09-27 MED ORDER — HYDROCODONE-ACETAMINOPHEN 5-325 MG PO TABS: 1.0000 | ORAL_TABLET | ORAL | Status: DC | PRN

## 2014-09-27 NOTE — ED Notes (Signed)
Pt complains of severe vaginal and perineal pain that began after intercourse approximately 8 days ago.  It is described as burning, ripping pain that makes it difficult to sit, stand, and work plus it is extremely painful to urinate and nearly impossible to defecate.  She reports no bowel movement for three days because of the perineal pain.

## 2014-09-27 NOTE — ED Provider Notes (Signed)
CSN: [id number]     Arrival date & time 09/27/14  1859 History  This chart was scribed for Ephraim Hamburger, MD by Einar Pheasant, ED Scribe. This patient was seen in room MH07/MH07 and the patient's care was started at 8:15 PM.    Chief Complaint  Patient presents with  . Vaginal Pain   The history is provided by the patient and medical records. No language interpreter was used.   HPI Comments: Madison Matthews is a 34 y.o. female with PMhx of cervical cancer presents to the Emergency Department complaining of vaginal pain that started 8 days ago after intercourse (pain started next day, not immediately). Pt is also complaining of perineal pain. She describes the pain as "ripping through her". Pt states that she's been experiencing burning pain with urination ("once the urine hits") and because of that she has been unable to wipe. When she does wipe however, she notices some pain and spotting. She states that she has not been able to have a BM secondary to the pain. Pt had an abortion at a [redacted] week gestation secondary to possible ectopic pregnancy. No abdominal pain, fever, chills, nausea, emesis, or vaginal discharge.  Past Medical History  Diagnosis Date  . Cervical cancer    Past Surgical History  Procedure Laterality Date  . Knee surgery    . Cervix surgery    . Appendectomy    . Cervical cone biopsy     Family History  Problem Relation Age of Onset  . Hypertension Mother   . Hypertension Father   . Heart attack Paternal Grandmother   . Diabetes Paternal Grandfather    History  Substance Use Topics  . Smoking status: Current Every Day Smoker -- 0.50 packs/day    Types: Cigarettes  . Smokeless tobacco: Not on file  . Alcohol Use: Yes     Comment: socially   OB History    Gravida Para Term Preterm AB TAB SAB Ectopic Multiple Living   3 1 1  0 1 0 1 0 0 1     Review of Systems  Constitutional: Negative for fever and chills.  Gastrointestinal: Negative for nausea, vomiting and  abdominal pain.  Genitourinary: Positive for difficulty urinating (secondary to pain) and vaginal pain. Negative for vaginal discharge.  All other systems reviewed and are negative.     Allergies  Review of patient's allergies indicates no known allergies.  Home Medications   Prior to Admission medications   Medication Sig Start Date End Date Taking? Authorizing Provider  traMADol (ULTRAM) 50 MG tablet Take 1-2 tablets (50-100 mg total) by mouth every 6 (six) hours as needed for severe pain. 08/19/14   Manya Silvas, CNM   BP 161/83 mmHg  Pulse 90  Temp(Src) 98.4 F (36.9 C) (Oral)  Ht 5\' 8"  (1.727 m)  Wt 225 lb (102.059 kg)  BMI 34.22 kg/m2  SpO2 100%  LMP 07/05/2014 (Exact Date)  Breastfeeding? Unknown  Physical Exam  Constitutional: She is oriented to person, place, and time. She appears well-developed and well-nourished. No distress.  HENT:  Head: Normocephalic and atraumatic.  Cardiovascular: Normal rate.   Pulmonary/Chest: Effort normal. No respiratory distress.  Abdominal: Soft. She exhibits no distension. There is no tenderness.  Genitourinary:    There is tenderness in the vagina. No bleeding in the vagina.  Neurological: She is alert and oriented to person, place, and time.  Skin: Skin is warm and dry. No erythema.  Psychiatric: She has a normal mood and  affect. Her behavior is normal.  Nursing note and vitals reviewed.   ED Course  Procedures (including critical care time)  DIAGNOSTIC STUDIES: Oxygen Saturation is 100% on RA, normal by my interpretation.    COORDINATION OF CARE: 8:21 PM- Pt advised of plan for treatment and pt agrees.  Labs Review Labs Reviewed  URINALYSIS, ROUTINE W REFLEX MICROSCOPIC - Abnormal; Notable for the following:    APPearance CLOUDY (*)    Leukocytes, UA MODERATE (*)    All other components within normal limits  URINE MICROSCOPIC-ADD ON - Abnormal; Notable for the following:    Squamous Epithelial / LPF FEW (*)     Bacteria, UA FEW (*)    All other components within normal limits  HERPES SIMPLEX VIRUS CULTURE  PREGNANCY, URINE    Imaging Review No results found.   EKG Interpretation None      MDM   Final diagnoses:  Vaginitis    Patient's exam is concerning for STI, likely herpes based on lesions and excruciating pain. D/w GYN, who will follow patient in clinic. Recommend swabbing and treating with valacyclovir. Will try lidocaine topically as well as narcotic for pain. No obvious lac.  I personally performed the services described in this documentation, which was scribed in my presence. The recorded information has been reviewed and is accurate.   Ephraim Hamburger, MD 09/28/14 [phone]

## 2014-09-27 NOTE — ED Notes (Signed)
Pt reports medically necessary abortion three weeks ago at approximately [redacted] weeks gestation.  Tolerated procedure well but has not yet resumed menstrual periods.

## 2014-09-28 ENCOUNTER — Telehealth: Payer: Self-pay | Admitting: *Deleted

## 2014-09-28 NOTE — Telephone Encounter (Signed)
Pt was instructed to call and ask about [facility] program.  Chart reviewed.  Spoke with pt concerning his medications and not being able to afford them.  Explained the [place]- Medication Assistance program to pt. Pt understands that the [facility] program is only a one time in one year from the date of discharge to next year. Pt also understands that there is a $3.00 co pay for each prescription.

## 2014-10-01 LAB — HERPES SIMPLEX VIRUS CULTURE: Culture: DETECTED

## 2014-10-03 ENCOUNTER — Telehealth (HOSPITAL_BASED_OUTPATIENT_CLINIC_OR_DEPARTMENT_OTHER): Payer: Self-pay | Admitting: Emergency Medicine

## 2015-04-07 ENCOUNTER — Encounter (HOSPITAL_BASED_OUTPATIENT_CLINIC_OR_DEPARTMENT_OTHER): Payer: Self-pay

## 2015-04-07 ENCOUNTER — Emergency Department (HOSPITAL_BASED_OUTPATIENT_CLINIC_OR_DEPARTMENT_OTHER): Payer: Medicaid Other

## 2015-04-07 ENCOUNTER — Emergency Department (HOSPITAL_BASED_OUTPATIENT_CLINIC_OR_DEPARTMENT_OTHER)
Admission: EM | Admit: 2015-04-07 | Discharge: 2015-04-07 | Disposition: A | Discharge status: Home / Self Care | Payer: Medicaid Other | Attending: Emergency Medicine | Admitting: Emergency Medicine

## 2015-04-07 ENCOUNTER — Emergency Department (HOSPITAL_BASED_OUTPATIENT_CLINIC_OR_DEPARTMENT_OTHER): Payer: Self-pay

## 2015-04-07 DIAGNOSIS — Y998 Other external cause status: Secondary | ICD-10-CM | POA: Insufficient documentation

## 2015-04-07 DIAGNOSIS — W010XXA Fall on same level from slipping, tripping and stumbling without subsequent striking against object, initial encounter: Secondary | ICD-10-CM | POA: Insufficient documentation

## 2015-04-07 DIAGNOSIS — S4992XA Unspecified injury of left shoulder and upper arm, initial encounter: Secondary | ICD-10-CM | POA: Insufficient documentation

## 2015-04-07 DIAGNOSIS — Y92009 Unspecified place in unspecified non-institutional (private) residence as the place of occurrence of the external cause: Secondary | ICD-10-CM

## 2015-04-07 DIAGNOSIS — Z8541 Personal history of malignant neoplasm of cervix uteri: Secondary | ICD-10-CM | POA: Insufficient documentation

## 2015-04-07 DIAGNOSIS — S31010A Laceration without foreign body of lower back and pelvis without penetration into retroperitoneum, initial encounter: Secondary | ICD-10-CM | POA: Insufficient documentation

## 2015-04-07 DIAGNOSIS — IMO0002 Reserved for concepts with insufficient information to code with codable children: Secondary | ICD-10-CM

## 2015-04-07 DIAGNOSIS — Y9289 Other specified places as the place of occurrence of the external cause: Secondary | ICD-10-CM | POA: Insufficient documentation

## 2015-04-07 DIAGNOSIS — W19XXXA Unspecified fall, initial encounter: Secondary | ICD-10-CM

## 2015-04-07 DIAGNOSIS — Z720 Tobacco use: Secondary | ICD-10-CM | POA: Insufficient documentation

## 2015-04-07 DIAGNOSIS — S43401A Unspecified sprain of right shoulder joint, initial encounter: Secondary | ICD-10-CM | POA: Insufficient documentation

## 2015-04-07 DIAGNOSIS — Y9389 Activity, other specified: Secondary | ICD-10-CM | POA: Insufficient documentation

## 2015-04-07 MED ORDER — ACETAMINOPHEN 325 MG PO TABS
975.0000 mg | ORAL_TABLET | Freq: Once | ORAL | Status: AC
  Administered 2015-04-07: 975 mg via ORAL
  Filled 2015-04-07: qty 3

## 2015-04-07 MED ORDER — HYDROCODONE-ACETAMINOPHEN 5-325 MG PO TABS
1.0000 | ORAL_TABLET | Freq: Once | ORAL | Status: AC
  Administered 2015-04-07: 1 via ORAL
  Filled 2015-04-07: qty 1

## 2015-04-07 MED ORDER — HYDROCODONE-ACETAMINOPHEN 5-325 MG PO TABS: ORAL_TABLET | ORAL | Status: DC

## 2015-04-07 MED ORDER — LIDOCAINE-EPINEPHRINE (PF) 2 %-1:200000 IJ SOLN
20.0000 mL | Freq: Once | INTRAMUSCULAR | Status: AC
  Administered 2015-04-07: 10 mL via INTRADERMAL
  Filled 2015-04-07: qty 20

## 2015-04-07 NOTE — Discharge Instructions (Signed)
Keep wound dry and do not remove dressing for 24 hours if possible. After that, wash gently morning and night (every 12 hours) with soap and water. Use a topical antibiotic ointment and cover with a bandaid or gauze.    Do NOT use rubbing alcohol or hydrogen peroxide, do not soak the area   Present to your primary care doctor or the urgent care of your choice, or the ED for suture removal in 7-10 days.   Every attempt was made to remove foreign body (contaminants) from the wound.  However, there is always a chance that some may remain in the wound. This can  increase your risk of infection.   If you see signs of infection (warmth, redness, tenderness, pus, sharp increase in pain, fever, red streaking in the skin) immediately return to the emergency department.   After the wound heals fully, apply sunscreen for 6-12 months to minimize scarring.   Take vicodin for breakthrough pain, do not drink alcohol, drive, care for children or do other critical tasks while taking vicodin.  Please follow with your primary care doctor in the next 2 days for a check-up. They must obtain records for further management.   Do not hesitate to return to the Emergency Department for any new, worsening or concerning symptoms.

## 2015-04-07 NOTE — ED Provider Notes (Signed)
CSN: [id number]     Arrival date & time 04/07/15  1655 History   First MD Initiated Contact with Patient 04/07/15 1717     Chief Complaint  Patient presents with  . Fall     (Consider location/radiation/quality/duration/timing/severity/associated sxs/prior Treatment) HPI  Blood pressure 149/61, pulse 87, temperature 98.7 F (37.1 C), temperature source Oral, resp. rate 18, height 5\' 8"  (1.727 m), weight 225 lb (102.059 kg), last menstrual period 03/19/2015, SpO2 100 %, unknown if currently breastfeeding.  Madison Matthews is a 34 y.o. female complaining of laceration to low back, low back pain, left shoulder pain and laceration to low back status post slip and fall. Patient was dog sitting for her friend, she fell while she was outside of the shower with the inside of the shower that had a chair in it. There was no head trauma. She fell more on her low back and right shoulder. She has a laceration to the low back and bleeding is controlled. States her last tetanus shot 3 years ago. Patient reports tingling in the bilateral fingertips and denies anesthesia or weakness. She reports the pain is severe, worse in the right shoulder and right trapezius, 8 out of 10 and exacerbated by movement and palpation.  Past Medical History  Diagnosis Date  . Cervical cancer    Past Surgical History  Procedure Laterality Date  . Knee surgery    . Cervix surgery    . Appendectomy    . Cervical cone biopsy     Family History  Problem Relation Age of Onset  . Hypertension Mother   . Hypertension Father   . Heart attack Paternal Grandmother   . Diabetes Paternal Grandfather    Social History  Substance Use Topics  . Smoking status: Current Every Day Smoker -- 0.50 packs/day    Types: Cigarettes  . Smokeless tobacco: None  . Alcohol Use: Yes     Comment: socially   OB History    Gravida Para Term Preterm AB TAB SAB Ectopic Multiple Living   3 1 1  0 1 0 1 0 0 1     Review of Systems  10  systems reviewed and found to be negative, except as noted in the HPI.   Allergies  Review of patient's allergies indicates no known allergies.  Home Medications   Prior to Admission medications   Medication Sig Start Date End Date Taking? Authorizing Provider  HYDROcodone-acetaminophen (NORCO) 5-325 MG per tablet Take 1-2 tablets by mouth every 4 (four) hours as needed for severe pain. 09/27/14   Sherwood Gambler, MD  lidocaine (XYLOCAINE) 5 % ointment Apply 1 application topically 3 (three) times daily as needed. 09/27/14   Sherwood Gambler, MD  traMADol (ULTRAM) 50 MG tablet Take 1-2 tablets (50-100 mg total) by mouth every 6 (six) hours as needed for severe pain. 08/19/14   Manya Silvas, CNM  valACYclovir (VALTREX) 1000 MG tablet Take 1 tablet (1,000 mg total) by mouth 2 (two) times daily. X 10 days 09/27/14   Sherwood Gambler, MD   BP 149/61 mmHg  Pulse 87  Temp(Src) 98.7 F (37.1 C) (Oral)  Resp 18  Ht 5\' 8"  (1.727 m)  Wt 225 lb (102.059 kg)  BMI 34.22 kg/m2  SpO2 100%  LMP 03/19/2015 Physical Exam  Constitutional: She is oriented to person, place, and time. She appears well-developed and well-nourished. No distress.  HENT:  Head: Normocephalic and atraumatic.  Mouth/Throat: Oropharynx is clear and moist.  No abrasions or contusions.  No hemotympanum, battle signs or raccoon's eyes  No crepitance or tenderness to palpation along the orbital rim.  EOMI intact with no pain or diplopia  No abnormal otorrhea or rhinorrhea. Nasal septum midline.  No intraoral trauma.  Eyes: Conjunctivae and EOM are normal. Pupils are equal, round, and reactive to light.  Neck: Normal range of motion. Neck supple.  No midline C-spine  tenderness to palpation or step-offs appreciated. Patient has full range of motion without pain.  Grip/Biceps/Tricep strength 5/5 bilaterally, sensation to UE intact bilaterally.    Cardiovascular: Normal rate, regular rhythm and intact distal pulses.    Pulmonary/Chest: Effort normal and breath sounds normal. No stridor. No respiratory distress. She has no wheezes. She has no rales. She exhibits no tenderness.  No  TTP or crepitance  Abdominal: Soft. Bowel sounds are normal. She exhibits no distension and no mass. There is no tenderness. There is no rebound and no guarding.  Musculoskeletal: Normal range of motion. She exhibits tenderness. She exhibits no edema.  Patient has right trapezius spasm with tenderness to palpation.  Right Shoulder:  Shoulder with no deformity. FROM to shoulder and elbow. No TTP of rotator cuff musculature. Drop arm negative. Neurovascularly intact   Neurological: She is alert and oriented to person, place, and time.  Strength 5/5 x4 extremities   Distal sensation intact  Skin: Skin is warm.  Patient has 2.5 cm full-thickness laceration to low back, bleeding is controlled, no gross contamination.  Psychiatric: She has a normal mood and affect.  Nursing note and vitals reviewed.   ED Course  LACERATION REPAIR Date/Time: 04/07/2015 7:19 PM Performed by: Monico Blitz Authorized by: Monico Blitz Consent: Verbal consent obtained. Risks and benefits: risks, benefits and alternatives were discussed Consent given by: patient Patient identity confirmed: verbally with patient Body area: trunk Laceration length: 2.5 cm Foreign bodies: no foreign bodies Tendon involvement: none Nerve involvement: none Vascular damage: no Anesthesia: local infiltration Local anesthetic: lidocaine 2% without epinephrine and lidocaine 2% with epinephrine Anesthetic total: 4 ml Patient sedated: no Preparation: Patient was prepped and draped in the usual sterile fashion. Irrigation solution: saline Irrigation method: syringe Amount of cleaning: extensive Debridement: none Degree of undermining: none Skin closure: Ethilon (4-0) Number of sutures: 3 Technique: running Approximation: close Approximation difficulty:  simple Dressing: antibiotic ointment and 4x4 sterile gauze Patient tolerance: Patient tolerated the procedure well with no immediate complications   (including critical care time) Labs Review Labs Reviewed - No data to display  Imaging Review Dg Lumbar Spine Complete  04/07/2015   CLINICAL DATA:  Fall in bathtub with low back pain, initial encounter  EXAM: LUMBAR SPINE - COMPLETE 4+ VIEW  COMPARISON:  None.  FINDINGS: There is no evidence of lumbar spine fracture. Alignment is normal. Intervertebral disc spaces are maintained.  IMPRESSION: No acute abnormality noted.   Electronically Signed   By: Inez Catalina M.D.   On: 04/07/2015 17:44   I have personally reviewed and evaluated these images and lab results as part of my medical decision-making.   EKG Interpretation None      MDM   Final diagnoses:  Fall at home, initial encounter  Shoulder sprain, right, initial encounter  Laceration    Filed Vitals:   04/07/15 1705 04/07/15 1917  BP: 149/61   Pulse: 87 75  Temp: 98.7 F (37.1 C)   TempSrc: Oral   Resp: 18 16  Height: 5\' 8"  (1.727 m)   Weight: 225 lb (102.059 kg)   SpO2:  100% 100%    Medications  lidocaine-EPINEPHrine (XYLOCAINE W/EPI) 2 %-1:200000 (PF) injection 20 mL (10 mLs Intradermal Given 04/07/15 1802)  acetaminophen (TYLENOL) tablet 975 mg (975 mg Oral Given 04/07/15 1803)  HYDROcodone-acetaminophen (NORCO/VICODIN) 5-325 MG per tablet 1 tablet (1 tablet Oral Given 04/07/15 1834)    Madison Matthews is a pleasant 34 y.o. female presenting Laceration status post slip and fall from standing at home. Patient also reports pain in shoulder, low back and right trapezius. She was reporting a pins and needles paresthesia to the bilateral fingertips. She has no midline tenderness palpation. X-ray of shoulder, C-spine the lumbar spine are negative. Laceration is cleaned and closed. Instructed patient on wound care and return precautions.   Evaluation does not show pathology  that would require ongoing emergent intervention or inpatient treatment. Pt is hemodynamically stable and mentating appropriately. Discussed findings and plan with patient/guardian, who agrees with care plan. All questions answered. Return precautions discussed and outpatient follow up given.   New Prescriptions   HYDROCODONE-ACETAMINOPHEN (NORCO/VICODIN) 5-325 MG PER TABLET    Take 1-2 tablets by mouth every 6 hours as needed for pain and/or cough.        Monico Blitz, PA-C 04/07/15 1922  Fredia Sorrow, MD 04/10/15 [id number]

## 2015-04-07 NOTE — ED Notes (Signed)
Reports around 2pm slipped and fell into the tub breaking a metal rod.  Reports it went into her lower back and she has a laceration.

## 2015-04-07 NOTE — ED Notes (Signed)
Patient has noted small 1 in laceration to her left lower back region. Bleeding controlled.

## 2015-11-04 DIAGNOSIS — C73 Malignant neoplasm of thyroid gland: Secondary | ICD-10-CM

## 2015-11-04 DIAGNOSIS — C099 Malignant neoplasm of tonsil, unspecified: Secondary | ICD-10-CM

## 2015-11-04 DIAGNOSIS — Z9889 Other specified postprocedural states: Secondary | ICD-10-CM

## 2015-11-04 DIAGNOSIS — E890 Postprocedural hypothyroidism: Secondary | ICD-10-CM

## 2015-11-04 HISTORY — PX: TONSILLECTOMY: SUR1361

## 2015-11-04 HISTORY — PX: TOTAL THYROIDECTOMY: SHX2547

## 2015-11-04 HISTORY — DX: Malignant neoplasm of thyroid gland: C73

## 2015-11-04 HISTORY — DX: Malignant neoplasm of tonsil, unspecified: C09.9

## 2015-11-04 HISTORY — DX: Postprocedural hypothyroidism: E89.0

## 2015-11-04 HISTORY — DX: Other specified postprocedural states: Z98.890

## 2015-12-02 ENCOUNTER — Other Ambulatory Visit (HOSPITAL_COMMUNITY): Payer: Self-pay | Admitting: Obstetrics and Gynecology

## 2015-12-16 ENCOUNTER — Encounter (HOSPITAL_COMMUNITY): Payer: Self-pay | Admitting: *Deleted

## 2016-01-10 NOTE — H&P (Signed)
Madison Matthews, Madison Matthews                ACCOUNT NO.:  [id number]  MEDICAL RECORD NO.:  [id number]  LOCATION:                                 FACILITY:  PHYSICIAN:  Lucille Passy. Ulanda Edison, M.D. DATE OF BIRTH:  14-Apr-1981  DATE OF ADMISSION:  01/13/2016 DATE OF DISCHARGE:                             HISTORY & PHYSICAL   DATE OF ADMISSION:  January 13, 2016.  HISTORY OF PRESENT ILLNESS:  This is a 35 year old white female, para 1- 0-1-1, who is admitted for D and C, conization of the cervix because of high-grade cervical dysplasia.  This patient has had a prior D and C and conization approximately 8 years ago for high-grade cervical dysplasia. The endocervical margin was positive, but the endocervical curettings was negative.  She was seen in March of 2017 after not having been seen for a while.  At that time, her Pap smear showed ASCUS and the HPV was positive.  She underwent colposcopy, cervical biopsy, and endocervical curettage, all of which showed high-grade dysplasia.  The patient was advised conization of the cervix and she is admitted now for that.  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  SOCIAL HISTORY:  She was a former smoker, claims to have stopped on September 15, 2015.  Occasionally drinks alcohol.  She is single.  She is sexually active.  PAST SURGICAL HISTORY:  She would accept blood transfusions and emergency surgery.  She has had removal of her thyroid in April 2017. She has had an appendectomy, had a cone biopsy in July 2010.  She had a laser vaporization of the vulva in 2010.  Therapeutic abortions x2.  She has a child who is 67 years old.  ALLERGIES:  She is allergic to penicillins.  MEDICATIONS:  She takes omeprazole and valacyclovir.  She has had a history of PID.  FAMILY HISTORY:  No significant family history.  PHYSICAL EXAMINATION:  GENERAL:  On admission  reveals a well-developed, but overweight white female, in no distress.  She is 5 feet 10 inches, 224 pounds, BMI is 32.2. VITAL SIGNS:  Blood pressure 160/80. HEAD, EYES, NOSE, AND THROAT:  Normal. NECK:  Supple.  The patient has had recent thyroid surgery. LUNGS:  Clear to auscultation. HEART:  Normal size and sounds. ABDOMEN:  Soft, nontender.  No masses are palpable. PELVIC:  Vulva and vagina are clean.  The cervix is everted.  The uterus is normal size and shape.  Adnexa are free of masses.  The patient was found to have an acute urinary infection, was placed on Macrobid at the time of her visit on January 06, 2016.  ADMITTING IMPRESSION:  Severe cervical dysplasia, recurrent.  The patient is admitted for D and C, conization of the cervix.  She understands the risks involved and is ready to proceed.     Lucille Passy. Ulanda Edison, M.D.   ______________________________ Lucille Passy. Ulanda Edison, M.D.    TFH/MEDQ  D:  01/10/2016  T:  01/10/2016  Job:  [id number]

## 2016-01-12 NOTE — Anesthesia Preprocedure Evaluation (Signed)
Anesthesia Evaluation  Patient identified by MRN, date of birth, ID band Patient awake    Reviewed: Allergy & Precautions, NPO status , Patient's Chart, lab work & pertinent test results  Airway Mallampati: II  TM Distance: >3 FB Neck ROM: Full    Dental no notable dental hx.    Pulmonary neg pulmonary ROS, Current Smoker,    Pulmonary exam normal breath sounds clear to auscultation       Cardiovascular negative cardio ROS Normal cardiovascular exam Rhythm:Regular Rate:Normal     Neuro/Psych PSYCHIATRIC DISORDERS Anxiety Depression negative neurological ROS     GI/Hepatic Neg liver ROS, GERD  ,  Endo/Other  negative endocrine ROS  Renal/GU negative Renal ROS     Musculoskeletal negative musculoskeletal ROS (+)   Abdominal (+) + obese,   Peds  Hematology negative hematology ROS (+)   Anesthesia Other Findings   Reproductive/Obstetrics negative OB ROS                             Anesthesia Physical Anesthesia Plan  ASA: II  Anesthesia Plan: General   Post-op Pain Management:    Induction: Intravenous  Airway Management Planned: LMA  Additional Equipment:   Intra-op Plan:   Post-operative Plan: Extubation in OR  Informed Consent: I have reviewed the patients History and Physical, chart, labs and discussed the procedure including the risks, benefits and alternatives for the proposed anesthesia with the patient or authorized representative who has indicated his/her understanding and acceptance.   Dental advisory given  Plan Discussed with: CRNA  Anesthesia Plan Comments:         Anesthesia Quick Evaluation

## 2016-01-13 ENCOUNTER — Ambulatory Visit (HOSPITAL_COMMUNITY): Payer: BLUE CROSS/BLUE SHIELD | Admitting: Anesthesiology

## 2016-01-13 ENCOUNTER — Encounter (HOSPITAL_COMMUNITY)
Admission: RE | Disposition: A | Discharge status: Home / Self Care | Payer: Self-pay | Source: Ambulatory Visit | Attending: Obstetrics and Gynecology

## 2016-01-13 ENCOUNTER — Ambulatory Visit (HOSPITAL_COMMUNITY)
Admission: RE | Admit: 2016-01-13 | Discharge: 2016-01-13 | Disposition: A | Discharge status: Home / Self Care | Payer: BLUE CROSS/BLUE SHIELD | Source: Ambulatory Visit | Attending: Obstetrics and Gynecology | Admitting: Obstetrics and Gynecology

## 2016-01-13 ENCOUNTER — Encounter (HOSPITAL_COMMUNITY): Payer: Self-pay | Admitting: *Deleted

## 2016-01-13 DIAGNOSIS — Z880 Allergy status to penicillin: Secondary | ICD-10-CM | POA: Insufficient documentation

## 2016-01-13 DIAGNOSIS — N871 Moderate cervical dysplasia: Secondary | ICD-10-CM | POA: Insufficient documentation

## 2016-01-13 DIAGNOSIS — Z6834 Body mass index (BMI) 34.0-34.9, adult: Secondary | ICD-10-CM | POA: Diagnosis not present

## 2016-01-13 DIAGNOSIS — F419 Anxiety disorder, unspecified: Secondary | ICD-10-CM | POA: Insufficient documentation

## 2016-01-13 DIAGNOSIS — Z87891 Personal history of nicotine dependence: Secondary | ICD-10-CM | POA: Insufficient documentation

## 2016-01-13 DIAGNOSIS — K219 Gastro-esophageal reflux disease without esophagitis: Secondary | ICD-10-CM | POA: Diagnosis not present

## 2016-01-13 DIAGNOSIS — N840 Polyp of corpus uteri: Secondary | ICD-10-CM | POA: Diagnosis not present

## 2016-01-13 DIAGNOSIS — D069 Carcinoma in situ of cervix, unspecified: Secondary | ICD-10-CM | POA: Diagnosis present

## 2016-01-13 DIAGNOSIS — Z79899 Other long term (current) drug therapy: Secondary | ICD-10-CM | POA: Diagnosis not present

## 2016-01-13 DIAGNOSIS — F329 Major depressive disorder, single episode, unspecified: Secondary | ICD-10-CM | POA: Diagnosis not present

## 2016-01-13 DIAGNOSIS — N879 Dysplasia of cervix uteri, unspecified: Secondary | ICD-10-CM

## 2016-01-13 DIAGNOSIS — E669 Obesity, unspecified: Secondary | ICD-10-CM | POA: Insufficient documentation

## 2016-01-13 HISTORY — DX: Anxiety disorder, unspecified: F41.9

## 2016-01-13 HISTORY — PX: CERVICAL CONIZATION W/BX: SHX1330

## 2016-01-13 HISTORY — DX: Malignant neoplasm of thyroid gland: C73

## 2016-01-13 HISTORY — DX: Herpesviral infection, unspecified: B00.9

## 2016-01-13 HISTORY — DX: Malignant neoplasm of tonsil, unspecified: C09.9

## 2016-01-13 HISTORY — DX: Gastro-esophageal reflux disease without esophagitis: K21.9

## 2016-01-13 HISTORY — DX: Postprocedural hypothyroidism: E89.0

## 2016-01-13 LAB — COMPREHENSIVE METABOLIC PANEL
ALBUMIN: 3.8 g/dL (ref 3.5–5.0)
ALK PHOS: 79 U/L (ref 38–126)
ALT: 20 U/L (ref 14–54)
ANION GAP: 3 — AB (ref 5–15)
AST: 20 U/L (ref 15–41)
BUN: 18 mg/dL (ref 6–20)
CO2: 27 mmol/L (ref 22–32)
Calcium: 9.1 mg/dL (ref 8.9–10.3)
Chloride: 108 mmol/L (ref 101–111)
Creatinine, Ser: 0.83 mg/dL (ref 0.44–1.00)
GFR calc Af Amer: >60 mL/min (ref >60)
GFR calc non Af Amer: >60 mL/min (ref >60)
GLUCOSE: 95 mg/dL (ref 65–99)
POTASSIUM: 4.3 mmol/L (ref 3.5–5.1)
SODIUM: 138 mmol/L (ref 135–145)
Total Bilirubin: 0.1 mg/dL — ABNORMAL LOW (ref 0.3–1.2)
Total Protein: 6.4 g/dL — ABNORMAL LOW (ref 6.5–8.1)

## 2016-01-13 LAB — CBC
HEMATOCRIT: 38.9 % (ref 36.0–46.0)
HEMOGLOBIN: 13.2 g/dL (ref 12.0–15.0)
MCH: 31 pg (ref 26.0–34.0)
MCHC: 33.9 g/dL (ref 30.0–36.0)
MCV: 91.3 fL (ref 78.0–100.0)
Platelets: 209 10*3/uL (ref 150–400)
RBC: 4.26 MIL/uL (ref 3.87–5.11)
RDW: 13.9 % (ref 11.5–15.5)
WBC: 9 10*3/uL (ref 4.0–10.5)

## 2016-01-13 LAB — TYPE AND SCREEN
ABO/RH(D): O POS
ANTIBODY SCREEN: NEGATIVE

## 2016-01-13 LAB — PREGNANCY, URINE: Preg Test, Ur: NEGATIVE

## 2016-01-13 SURGERY — CONE BIOPSY, CERVIX
Anesthesia: General

## 2016-01-13 MED ORDER — HYDROMORPHONE HCL 1 MG/ML IJ SOLN
0.2500 mg | INTRAMUSCULAR | Status: DC | PRN
  Administered 2016-01-13 (×2): 0.5 mg via INTRAVENOUS

## 2016-01-13 MED ORDER — GLYCOPYRROLATE 0.2 MG/ML IJ SOLN
INTRAMUSCULAR | Status: DC | PRN
  Administered 2016-01-13 (×2): 0.1 mg via INTRAVENOUS

## 2016-01-13 MED ORDER — HYDROMORPHONE HCL 1 MG/ML IJ SOLN
INTRAMUSCULAR | Status: AC
  Administered 2016-01-13: 0.5 mg via INTRAVENOUS
  Filled 2016-01-13: qty 1

## 2016-01-13 MED ORDER — MEPERIDINE HCL 25 MG/ML IJ SOLN
INTRAMUSCULAR | Status: AC
  Filled 2016-01-13: qty 1

## 2016-01-13 MED ORDER — KETOROLAC TROMETHAMINE 30 MG/ML IJ SOLN
INTRAMUSCULAR | Status: DC | PRN
  Administered 2016-01-13: 30 mg via INTRAVENOUS

## 2016-01-13 MED ORDER — SCOPOLAMINE 1 MG/3DAYS TD PT72
MEDICATED_PATCH | TRANSDERMAL | Status: AC
  Administered 2016-01-13: 1.5 mg via TRANSDERMAL
  Filled 2016-01-13: qty 1

## 2016-01-13 MED ORDER — PROPOFOL 10 MG/ML IV BOLUS
INTRAVENOUS | Status: DC | PRN
  Administered 2016-01-13: 200 mg via INTRAVENOUS

## 2016-01-13 MED ORDER — MIDAZOLAM HCL 2 MG/2ML IJ SOLN
INTRAMUSCULAR | Status: AC
  Filled 2016-01-13: qty 2

## 2016-01-13 MED ORDER — ACETIC ACID 5 % SOLN
Status: AC
  Filled 2016-01-13: qty 500

## 2016-01-13 MED ORDER — GLYCOPYRROLATE 0.2 MG/ML IJ SOLN
INTRAMUSCULAR | Status: AC
  Filled 2016-01-13: qty 1

## 2016-01-13 MED ORDER — LIDOCAINE HCL (CARDIAC) 20 MG/ML IV SOLN
INTRAVENOUS | Status: AC
  Filled 2016-01-13: qty 5

## 2016-01-13 MED ORDER — SODIUM CHLORIDE 0.9 % IJ SOLN
INTRAMUSCULAR | Status: AC
  Filled 2016-01-13: qty 150

## 2016-01-13 MED ORDER — LIDOCAINE HCL 1 % IJ SOLN
INTRAMUSCULAR | Status: AC
  Filled 2016-01-13: qty 20

## 2016-01-13 MED ORDER — SCOPOLAMINE 1 MG/3DAYS TD PT72
1.0000 | MEDICATED_PATCH | Freq: Once | TRANSDERMAL | Status: DC
  Administered 2016-01-13: 1.5 mg via TRANSDERMAL

## 2016-01-13 MED ORDER — PROPOFOL 10 MG/ML IV BOLUS
INTRAVENOUS | Status: AC
  Filled 2016-01-13: qty 20

## 2016-01-13 MED ORDER — DEXAMETHASONE SODIUM PHOSPHATE 10 MG/ML IJ SOLN
INTRAMUSCULAR | Status: DC | PRN
  Administered 2016-01-13: 10 mg via INTRAVENOUS

## 2016-01-13 MED ORDER — PROMETHAZINE HCL 25 MG/ML IJ SOLN
6.2500 mg | INTRAMUSCULAR | Status: DC | PRN
Start: 2016-01-13 — End: 2016-01-13

## 2016-01-13 MED ORDER — LIDOCAINE HCL (CARDIAC) 20 MG/ML IV SOLN
INTRAVENOUS | Status: DC | PRN
  Administered 2016-01-13: 100 mg via INTRAVENOUS

## 2016-01-13 MED ORDER — ACETIC ACID 5 % SOLN
Status: DC | PRN
  Administered 2016-01-13: 1 via TOPICAL

## 2016-01-13 MED ORDER — SODIUM CHLORIDE 0.9 % IJ SOLN
INTRAMUSCULAR | Status: DC | PRN
  Administered 2016-01-13: 120 mL

## 2016-01-13 MED ORDER — IBUPROFEN 600 MG PO TABS: 600.0000 mg | ORAL_TABLET | Freq: Four times a day (QID) | ORAL | Status: AC | PRN

## 2016-01-13 MED ORDER — LACTATED RINGERS IV SOLN
INTRAVENOUS | Status: DC
  Administered 2016-01-13 (×2): via INTRAVENOUS

## 2016-01-13 MED ORDER — FENTANYL CITRATE (PF) 100 MCG/2ML IJ SOLN
INTRAMUSCULAR | Status: DC | PRN
  Administered 2016-01-13 (×5): 50 ug via INTRAVENOUS

## 2016-01-13 MED ORDER — VASOPRESSIN 20 UNIT/ML IV SOLN
INTRAVENOUS | Status: DC | PRN
  Administered 2016-01-13: 10 [IU]

## 2016-01-13 MED ORDER — MIDAZOLAM HCL 2 MG/2ML IJ SOLN
INTRAMUSCULAR | Status: DC | PRN
  Administered 2016-01-13: 2 mg via INTRAVENOUS

## 2016-01-13 MED ORDER — VASOPRESSIN 20 UNIT/ML IV SOLN
INTRAVENOUS | Status: AC
  Filled 2016-01-13: qty 1

## 2016-01-13 MED ORDER — FERRIC SUBSULFATE 259 MG/GM EX SOLN
CUTANEOUS | Status: AC
  Filled 2016-01-13: qty 8

## 2016-01-13 MED ORDER — MEPERIDINE HCL 25 MG/ML IJ SOLN
6.2500 mg | INTRAMUSCULAR | Status: DC | PRN
  Administered 2016-01-13: 6.25 mg via INTRAVENOUS

## 2016-01-13 MED ORDER — FERRIC SUBSULFATE SOLN
Status: DC | PRN
  Administered 2016-01-13: 1

## 2016-01-13 MED ORDER — DEXAMETHASONE SODIUM PHOSPHATE 10 MG/ML IJ SOLN
INTRAMUSCULAR | Status: AC
  Filled 2016-01-13: qty 1

## 2016-01-13 MED ORDER — ONDANSETRON HCL 4 MG/2ML IJ SOLN
INTRAMUSCULAR | Status: AC
  Filled 2016-01-13: qty 2

## 2016-01-13 MED ORDER — FENTANYL CITRATE (PF) 250 MCG/5ML IJ SOLN
INTRAMUSCULAR | Status: AC
  Filled 2016-01-13: qty 5

## 2016-01-13 MED ORDER — ONDANSETRON HCL 4 MG/2ML IJ SOLN
INTRAMUSCULAR | Status: DC | PRN
  Administered 2016-01-13: 4 mg via INTRAVENOUS

## 2016-01-13 MED ORDER — KETOROLAC TROMETHAMINE 30 MG/ML IJ SOLN
INTRAMUSCULAR | Status: AC
  Filled 2016-01-13: qty 1

## 2016-01-13 SURGICAL SUPPLY — 25 items
APPLICATOR COTTON TIP 6IN STRL (MISCELLANEOUS) ×4 IMPLANT
BLADE SURG 15 STRL LF C SS BP (BLADE) ×1 IMPLANT
BLADE SURG 15 STRL SS (BLADE) ×3
CLOTH BEACON ORANGE TIMEOUT ST (SAFETY) ×3 IMPLANT
CONTAINER PREFILL 10% NBF 60ML (FORM) ×9 IMPLANT
COUNTER NEEDLE 1200 MAGNETIC (NEEDLE) ×3 IMPLANT
ELECT REM PT RETURN 9FT ADLT (ELECTROSURGICAL) ×3
ELECTRODE REM PT RTRN 9FT ADLT (ELECTROSURGICAL) IMPLANT
GLOVE BIO SURGEON STRL SZ7.5 (GLOVE) ×3 IMPLANT
GLOVE BIOGEL PI IND STRL 7.0 (GLOVE) ×1 IMPLANT
GLOVE BIOGEL PI INDICATOR 7.0 (GLOVE) ×2
GOWN STRL REUS W/TWL LRG LVL3 (GOWN DISPOSABLE) ×6 IMPLANT
NS IRRIG 1000ML POUR BTL (IV SOLUTION) ×1 IMPLANT
PACK VAGINAL MINOR WOMEN LF (CUSTOM PROCEDURE TRAY) ×3 IMPLANT
PAD OB MATERNITY 4.3X12.25 (PERSONAL CARE ITEMS) ×3 IMPLANT
PENCIL BUTTON HOLSTER BLD 10FT (ELECTRODE) ×3 IMPLANT
SCOPETTES 8  STERILE (MISCELLANEOUS) ×4
SCOPETTES 8 STERILE (MISCELLANEOUS) ×2 IMPLANT
SPONGE SURGIFOAM ABS GEL 12-7 (HEMOSTASIS) IMPLANT
SUT CHROMIC 0 CT 1 (SUTURE) ×6 IMPLANT
TOWEL OR 17X24 6PK STRL BLUE (TOWEL DISPOSABLE) ×6 IMPLANT
TUBING NON-CON 1/4 X 20 CONN (TUBING) ×2 IMPLANT
TUBING NON-CON 1/4 X 20' CONN (TUBING) ×1
WATER STERILE IRR 1000ML POUR (IV SOLUTION) ×3 IMPLANT
YANKAUER SUCT BULB TIP NO VENT (SUCTIONS) ×3 IMPLANT

## 2016-01-13 NOTE — Progress Notes (Signed)
Patient ID: Madison Matthews, female   DOB: July 03, 1981, 35 y.o.   MRN: [id number] Op note:  Pre and post op dx: Severe cervical dysplasia Op: D&C conization of the cervix  Op: Mayre Bury  General anesthesia  EBL < 50 cc's.

## 2016-01-13 NOTE — Transfer of Care (Signed)
Immediate Anesthesia Transfer of Care Note  Patient: Madison Matthews  Procedure(s) Performed: Procedure(s): CONIZATION CERVIX WITH BIOPSY (N/A)  Patient Location: PACU  Anesthesia Type:General  Level of Consciousness: awake, alert , oriented and patient cooperative  Airway & Oxygen Therapy: Patient Spontanous Breathing and Patient connected to nasal cannula oxygen  Post-op Assessment: Report given to RN and Post -op Vital signs reviewed and stable  Post vital signs: Reviewed and stable  Last Vitals:  Filed Vitals:   01/13/16 0627  BP: 130/85  Pulse: 66  Temp: 36.7 C  Resp: 18    Last Pain: There were no vitals filed for this visit.    Patients Stated Pain Goal: 3 ([id number])  Complications: No apparent anesthesia complications

## 2016-01-13 NOTE — Op Note (Signed)
[name], Madison Matthews                ACCOUNT NO.:  [id number]  MEDICAL RECORD NO.:  [id number]  LOCATION:  [facility]                          FACILITY:  [facility]  PHYSICIAN:  Lucille Passy. Ulanda Edison, M.D. DATE OF BIRTH:  18-Sep-1980  DATE OF PROCEDURE:  01/13/2016 DATE OF DISCHARGE:                              OPERATIVE REPORT   PREOPERATIVE DIAGNOSIS:  Severe cervical dysplasia.  POSTOPERATIVE DIAGNOSIS:  Severe cervical dysplasia.  OPERATION:  D and C, conization of the cervix.  OPERATOR:  Lucille Passy. Ulanda Edison, M.D.  ANESTHESIA:  General anesthesia.  DESCRIPTION OF PROCEDURE:  The patient was brought to the operating room and placed under satisfactory general anesthesia, placed in lithotomy position.  Exam in the office had been unremarkable.  The outer vulva was prepped with Betadine solution.  The cervix was visualized with a weighted speculum and a Sims retractor.  Acetic acid was used to paint the cervix.  I then looked with the colposcope.  The squamocolumnar junction was seen and the tissue appeared to have some white epithelium. I did not see any mosaic.  The abnormality was pretty much circumferential around the cervix, but did not extend into the fornices. The colposcope was then removed.  The cervicovaginal junction was injected with a dilute solution of Neo-Synephrine mixed with 10 units in 120 mL of saline, I used about 20 mL, so a total of about 1.6 units. The area was then prepped with Betadine solution and I did a conization in a routine fashion trying to go beyond the limits of the abnormality seen on colposcopy.  After the cone was removed, I cut it at 12 o'clock. I then did an endocervical curettage, producing very minimal tissue, but I think there was some tissue.  I then dilated the cervix up to a 23 Pratt dilator, did an endometrial curettage producing endometrial tissue.  I then reapproximated the cervix with 4 sutures of 0 chromic going from 10:30-01:30, 04:30-07:30,  01:30-04:30, and 07:30-10:30. Before I had actually done the conization, I prepped this urethra with Betadine and did a cath urine to send for culture because 1 week ago the patient had a UTI.  I saw her in the office, treated her with Macrobid. She claimed that she was not improving much so I switched to the Cipro. She said she had improved, but was still not completely well, so I want to see if there is any existing infection.  I resounded the uterus to make sure that the canal was patent.  I placed Monsel's in the endocervical bed, and I had obtained some hemostasis with the Bovie. The patient seemed to tolerate the procedure well.  Blood loss was no more than 50 mL.  Sponge and needle counts were correct, and she was returned to recovery in satisfactory condition.    Lucille Passy. Ulanda Edison, M.D.    TFH/MEDQ  D:  01/13/2016  T:  01/13/2016  Job:  [id number]

## 2016-01-13 NOTE — Anesthesia Postprocedure Evaluation (Signed)
Anesthesia Post Note  Patient: Madison Matthews  Procedure(s) Performed: Procedure(s) (LRB): CONIZATION CERVIX WITH BIOPSY (N/A)  Patient location during evaluation: PACU Anesthesia Type: General Level of consciousness: sedated and patient cooperative Pain management: pain level controlled Vital Signs Assessment: post-procedure vital signs reviewed and stable Respiratory status: spontaneous breathing Cardiovascular status: stable Anesthetic complications: no    Last Vitals:  Filed Vitals:   01/13/16 0948 01/13/16 1015  BP:  137/75  Pulse:  55  Temp: 37 C   Resp:  18    Last Pain:  Filed Vitals:   01/13/16 1024  PainSc: Clark Mcshan

## 2016-01-13 NOTE — Anesthesia Procedure Notes (Signed)
Procedure Name: LMA Insertion Date/Time: 01/13/2016 7:30 AM Performed by: Georgeanne Nim Pre-anesthesia Checklist: Patient identified, Timeout performed, Emergency Drugs available, Suction available and Patient being monitored Patient Re-evaluated:Patient Re-evaluated prior to inductionOxygen Delivery Method: Circle system utilized Preoxygenation: Pre-oxygenation with 100% oxygen Intubation Type: IV induction Ventilation: Mask ventilation without difficulty LMA: LMA inserted LMA Size: 4.0 Number of attempts: 1 Placement Confirmation: positive ETCO2,  CO2 detector and breath sounds checked- equal and bilateral Tube secured with: Tape Dental Injury: Teeth and Oropharynx as per pre-operative assessment

## 2016-01-13 NOTE — Progress Notes (Signed)
Patient ID: Madison Matthews, female   DOB: 09-26-1980, 35 y.o.   MRN: [id number] I examined this lady 01-06-16 and she reports no change in her health since that time.

## 2016-01-13 NOTE — Discharge Instructions (Signed)
No vaginal entrance. Call with temp> 100.4 degrees or heavy bleeding. Call with any problems. DISCHARGE INSTRUCTIONS: D&C / D&E The following instructions have been prepared to help you care for yourself upon your return home.   Personal hygiene:  Use sanitary pads for vaginal drainage, not tampons.  Shower the day after your procedure.  NO tub baths, pools or Jacuzzis for 2-3 weeks.  Wipe front to back after using the bathroom.  Activity and limitations:  Do NOT drive or operate any equipment for 24 hours. The effects of anesthesia are still present and drowsiness may result.  Do NOT rest in bed all day.  Walking is encouraged.  Walk up and down stairs slowly.  You may resume your normal activity in one to two days or as indicated by your physician.  Sexual activity: NO intercourse for at least 2 weeks after the procedure, or as indicated by your physician.  Diet: Eat a light meal as desired this evening. You may resume your usual diet tomorrow.  Return to work: You may resume your work activities in one to two days or as indicated by your doctor.  What to expect after your surgery: Expect to have vaginal bleeding/discharge for 2-3 days and spotting for up to 10 days. It is not unusual to have soreness for up to 1-2 weeks. You may have a slight burning sensation when you urinate for the first day. Mild cramps may continue for a couple of days. You may have a regular period in 2-6 weeks.  Call your doctor for any of the following:  Excessive vaginal bleeding, saturating and changing one pad every hour.  Inability to urinate 6 hours after discharge from hospital.  Pain not relieved by pain medication.  Fever of 100.4 F or greater.  Unusual vaginal discharge or odor.   Call for an appointment:    Patients signature: ______________________  Nurses signature ________________________  Support person's signature_______________________

## 2016-01-14 ENCOUNTER — Encounter (HOSPITAL_COMMUNITY): Payer: Self-pay | Admitting: Obstetrics and Gynecology

## 2016-01-14 LAB — URINE CULTURE: CULTURE: NO GROWTH
